# Patient Record
Sex: Female | Born: 1985 | Race: Black or African American | Hispanic: No | Marital: Single | Smoking: Current every day smoker
Health system: Southern US, Community
[De-identification: ages and names within clinical notes are randomized; demographics above are authoritative.]

## PROBLEM LIST (undated history)

## (undated) ENCOUNTER — Inpatient Hospital Stay (HOSPITAL_COMMUNITY): Payer: Self-pay

---

## 2011-12-14 ENCOUNTER — Other Ambulatory Visit: Payer: Self-pay

## 2011-12-20 ENCOUNTER — Other Ambulatory Visit (HOSPITAL_COMMUNITY): Payer: Self-pay | Admitting: Obstetrics and Gynecology

## 2011-12-20 DIAGNOSIS — IMO0002 Reserved for concepts with insufficient information to code with codable children: Secondary | ICD-10-CM

## 2011-12-22 ENCOUNTER — Ambulatory Visit (HOSPITAL_COMMUNITY)
Admission: RE | Admit: 2011-12-22 | Discharge: 2011-12-22 | Disposition: A | Discharge status: Home / Self Care | Payer: Medicaid Other | Source: Ambulatory Visit | Attending: Obstetrics and Gynecology | Admitting: Obstetrics and Gynecology

## 2011-12-22 ENCOUNTER — Encounter (HOSPITAL_COMMUNITY): Payer: Self-pay

## 2011-12-22 VITALS — BP 112/68 | HR 92 | Wt 226.2 lb

## 2011-12-22 DIAGNOSIS — IMO0002 Reserved for concepts with insufficient information to code with codable children: Secondary | ICD-10-CM

## 2011-12-22 DIAGNOSIS — O358XX0 Maternal care for other (suspected) fetal abnormality and damage, not applicable or unspecified: Secondary | ICD-10-CM | POA: Insufficient documentation

## 2011-12-22 IMAGING — US US OB DETAIL+14 WK
1 series · 16 of 28 positions shown · non-contrast
Comparison: none

[Series 1: us ob detail+14 wk · 0.24mm/px · 16 of 103 slices shown]
[im 1/103]
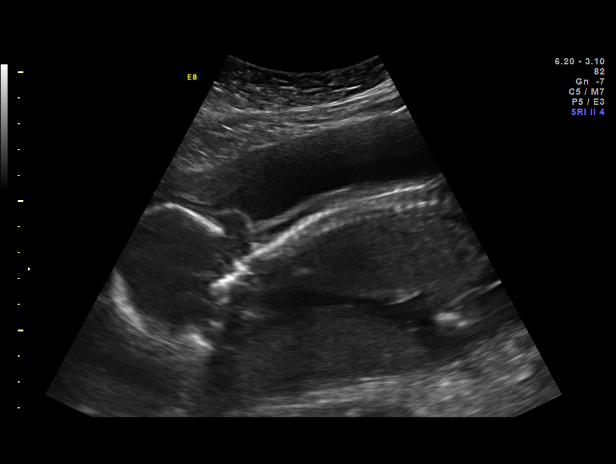
[im 8/103]
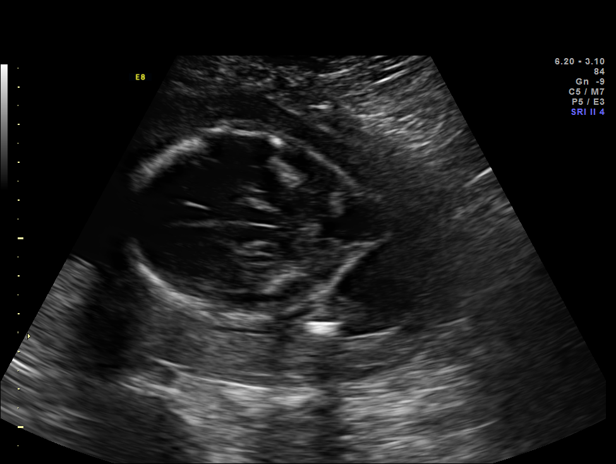
[im 16/103]
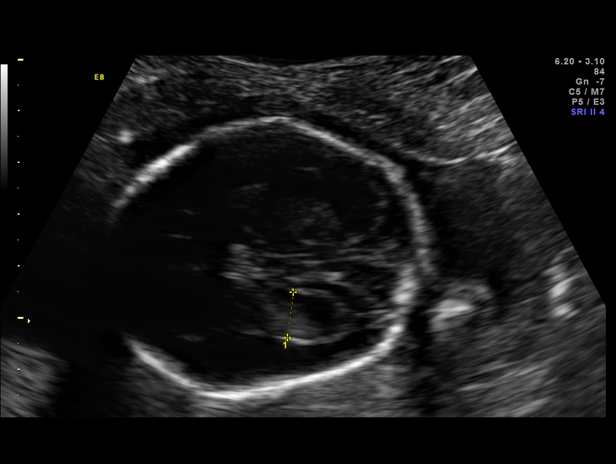
[im 23/103]
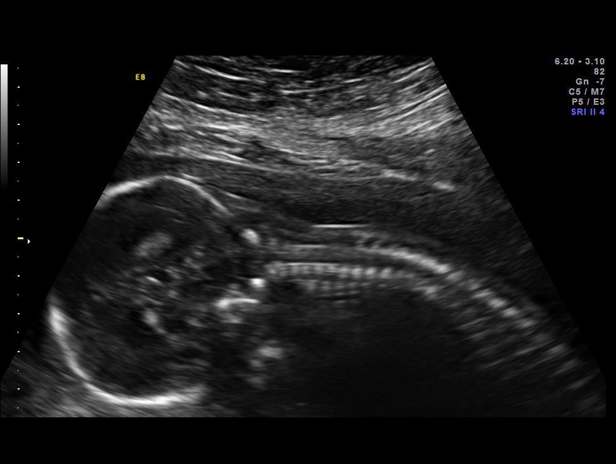
[im 27/103]
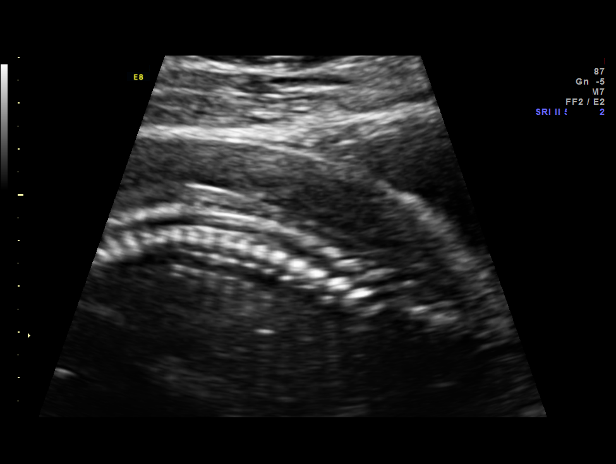
[im 35/103]
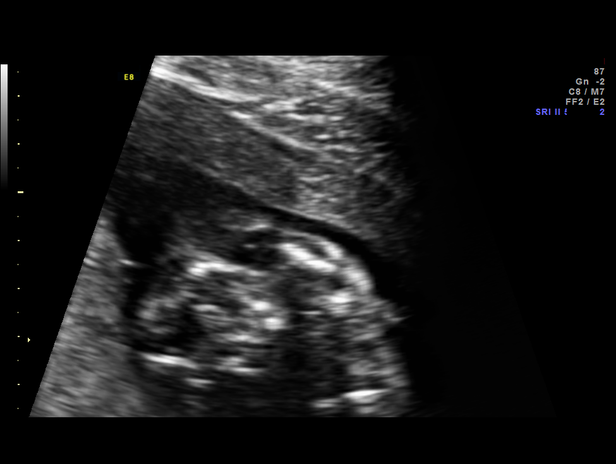
[im 42/103]
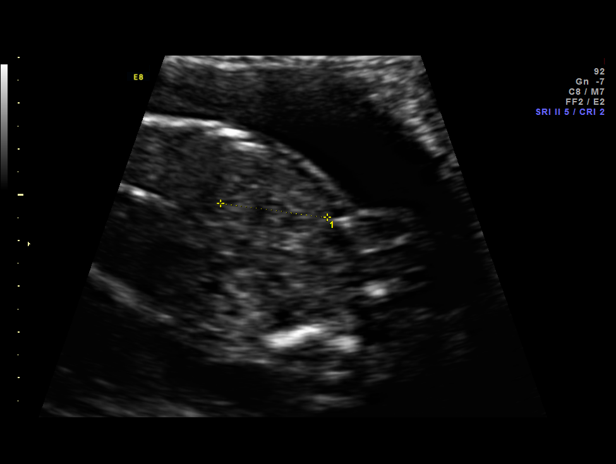
[im 50/103]
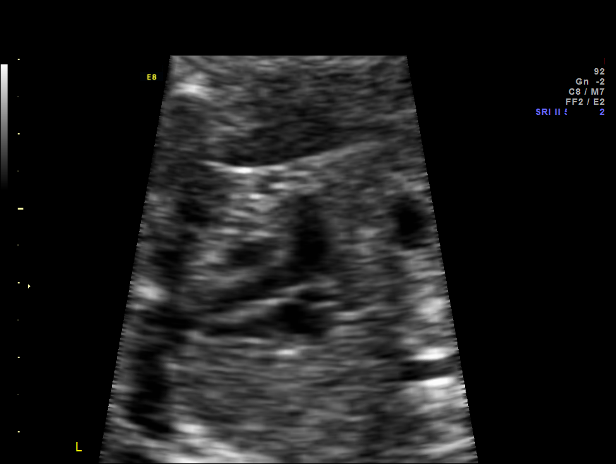
[im 53/103]
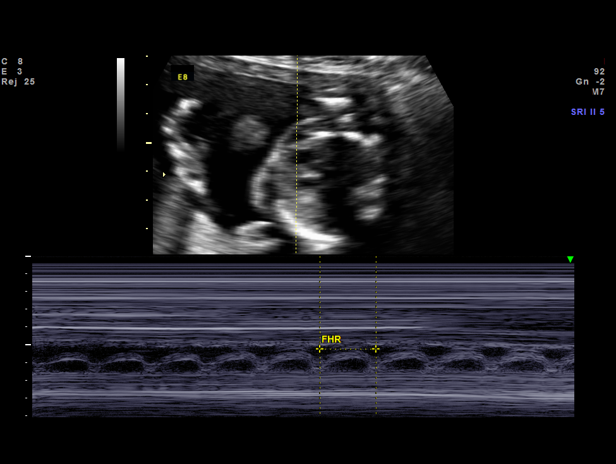
[im 61/103]
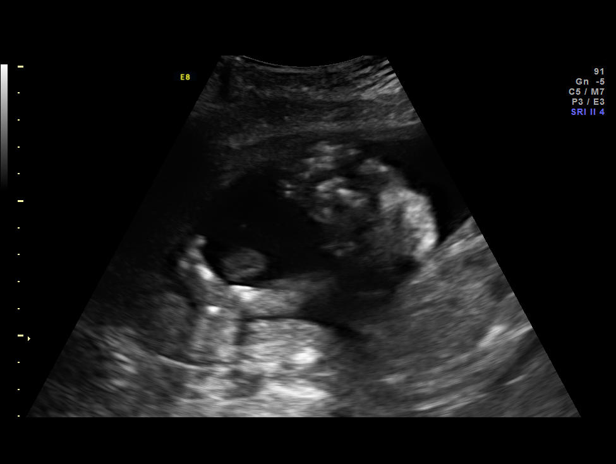
[im 69/103]
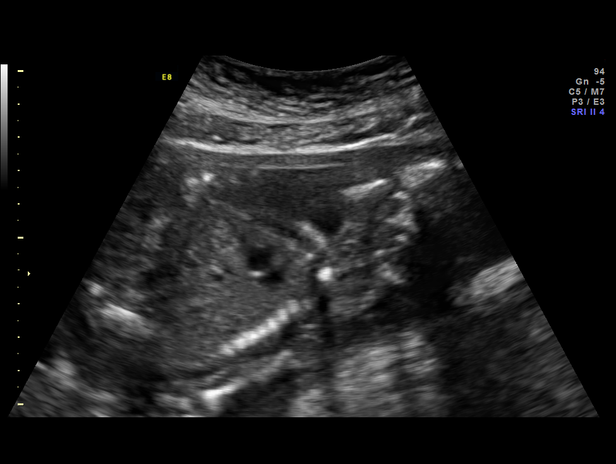
[im 76/103]
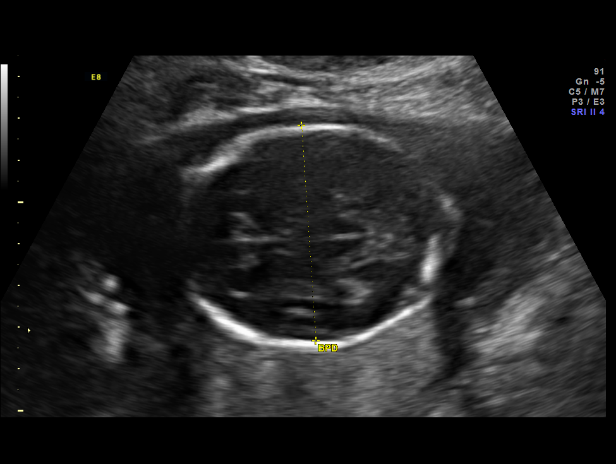
[im 80/103]
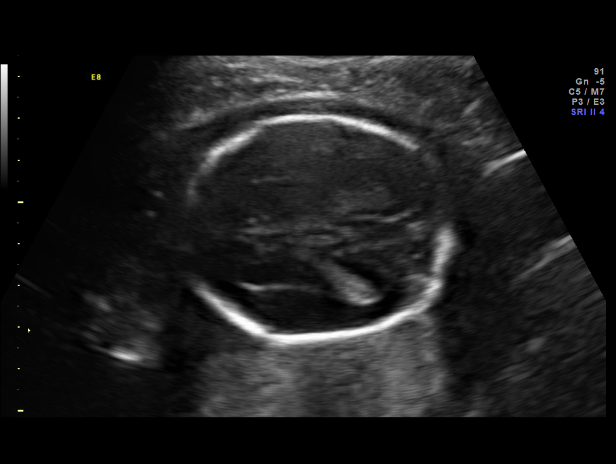
[im 87/103]
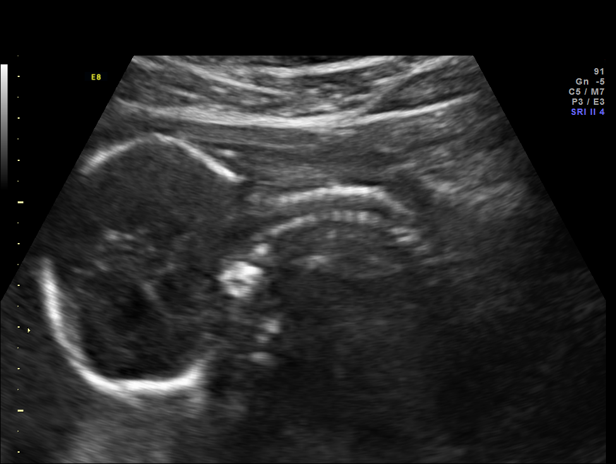
[im 95/103]
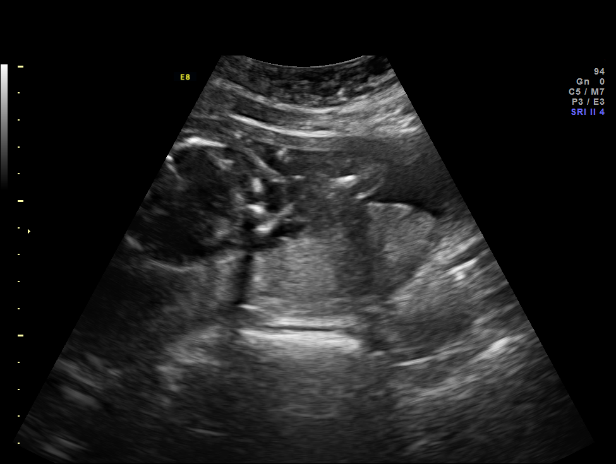
[im 103/103]
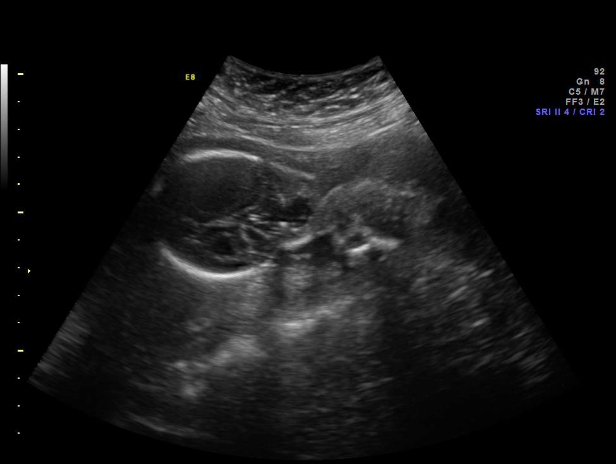

[16 of 28 positions shown; findings below may reference images not displayed]

OBSTETRICS REPORT
                      (Signed Final 12/22/2011 [DATE])

 Order#:         [id number]
Procedures

 US OB DETAIL + 14 WK                                  76811.0
Indications

 Fetal abnormality - other known or suspected
 (specify) cystic hygroma
Fetal Evaluation

 Fetal Heart Rate:  152                          bpm
 Cardiac Activity:  Observed
 Presentation:      Breech
 Placenta:          Posterior, above cervical
                    os
 P. Cord            Visualized
 Insertion:

 Amniotic Fluid
 AFI FV:      Subjectively within normal limits
                                             Larg Pckt:     4.3  cm
Biometry

 BPD:     51.8  mm     G. Age:  21w 5d                CI:         77.5   70 - 86
 OFD:     66.8  mm                                    FL/HC:      18.7   18.4 -

 HC:     191.8  mm     G. Age:  21w 3d       18  %    HC/AC:      1.14   1.06 -

 AC:     167.7  mm     G. Age:  21w 5d       36  %    FL/BPD:     69.3   71 - 87
 FL:      35.9  mm     G. Age:  21w 3d       21  %    FL/AC:      21.4   20 - 24
 HUM:     35.4  mm     G. Age:  22w 2d       55  %

 Est. FW:     435  gm    0 lb 15 oz      34  %
Gestational Age

 Clinical EDD:  22w 0d                                        EDD:   04/26/12
 U/S Today:     21w 4d                                        EDD:   04/29/12
 Best:          22w 0d     Det. By:  Clinical EDD             EDD:   04/26/12
Anatomy
 Cranium:           Abnormal, see       Aortic Arch:       Not well
                    comments
                                                           visualized
 Fetal Cavum:       Appears normal      Ductal Arch:       Not well
                                                           visualized
 Ventricles:        Ventriculomega      Diaphragm:         Appears normal
                    Mp, atrium
                    measures   mm
 Choroid Plexus:    Appears normal      Stomach:           Appears
                                                           normal, left
                                                           sided
 Cerebellum:        Abnormal, see       Abdomen:           Appears normal
                    comments
 Posterior Fossa:   Dandy Walker        Abdominal Wall:    Appears nml
                    complex
                                                           (cord insert,
                                                           abd wall)
 Nuchal Fold:       Not applicable      Cord Vessels:      Appears normal
                    (>20 wks GA)                           (3 vessel cord)
 Face:              Appears normal      Kidneys:           Appear normal
                    (lips/profile/orbit
                    s)
 Heart:             Appears normal      Bladder:           Appears normal
                    (4 chamber &
                    axis)
 RVOT:              Appears normal      Limbs:             Appears normal
                                                           (hands, ankles,
                                                           feet)
 LVOT:              Appears normal

 Other:     Male gender. Heels and 5th digit visualized.
Cervix Uterus Adnexa

 Cervical Length:    3        cm
 Left Ovary:    Not visualized.
 Right Ovary:   Not visualized.

 Adnexa:     No abnormality visualized.
Comments

 Ms. Mildre is referred due to cystic structure in the upper neck/
 skull.  Her quad screen was within normal limits (MSAFP
 MOM).  On ultrasound, an occipital encephalocele is noted.
 No brain matter is appreciated in the encephalocele sac.
 There appears to be a defect in the cerebellar vemis (Dandy
 Walker Variant).  The lateral ventricles measure 9 mm but
 "dangling choroids" are noted consistent with mild
 ventriculomegaly.  The remainder of the fetal anatomy
 appears normal, although somewhat limited views of the fetal
 heart were obtained.

 The findings and limitations of the study were discussed with
 the patient.  She was seen for genetics counseling - declined
 amniocentesis at this time and has elected to return for
 further follow up/ work up at a later time.
Impression

 Single IUP at 21 [DATE] weeks
 Occipiatal encephalocele
 Suspected Dandy Walker Variant
 Mild ventriculomegaly
 The remainder of the anatomy is within normal limits
 Normal amniotic fluid volume
Recommendations

 Recommend follow up ultrasound in 4 weeks with MFM.
 Will schedule consultation with Peds Neurosurgery.
 Plan fetal MRI to better evaluate the fetal cranial anatomy.
 Will also schedule fetal echo with Peds cardiology.

 Thank you for sharing in the care of Ms. TESY EXPRES with
 questions or concerns.

## 2011-12-22 NOTE — Progress Notes (Signed)
Tamara Johnston  was seen today for an ultrasound appointment.  See full report in [facility].  Alpha Gula, MD  [name] is referred due to cystic structure in the upper neck/ skull.  Her quad screen was within normal limits (MSAFP 1.7 MOM).  On ultrasound, an occipital encephalocele is noted.  No brain matter is appreciated in the encephalocele sac.  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  The lateral ventricles measure 9 mm but "dangling choroids" are noted consistent with mild ventriculomegaly.  The remainder of the fetal anatomy appears normal, although somewhat limited views of the fetal heart were obtained.  The findings and limitations of the study were discussed with the patient.  She was seen for genetics counseling - declined amniocentesis at this time and has elected to return for further follow up/ work up at a later time.  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 Will schedule consultation with Peds Neurosurgery. Plan fetal MRI to better evaluate the fetal cranial anatomy. Will also schedule fetal echo with Peds cardiology.

## 2011-12-23 ENCOUNTER — Telehealth (HOSPITAL_COMMUNITY): Payer: Self-pay | Admitting: MS"

## 2011-12-23 NOTE — Telephone Encounter (Signed)
Called [name] to follow-up from her visit yesterday to see how patient is doing, given that the patient was visibly upset, understandably so at the time of her visit. [name] stated that she is doing OK. I discussed that at the time of our visit, we had not discussed the option of termination of pregnancy and reviewed that this is an option for the pregnancy. [name] stated that she is not considering termination of pregnancy. Reviewed the follow-up plan that was discussed yesterday including follow-up ultrasound in 4 weeks, fetal MRI to better visualize the fetal brain, and fetal echocardiogram. Discussed that ultrasound did not suggest a concern with the fetal heart, but is offered given the association with encephalocele and other birth defects. Additionally, we discussed that we can facilitate a prenatal consult with neurosurgery. [name] agreed for [place] to schedule these appointments and contact her with appointment dates and times.   Clydie Braun Janiel Derhammer 12/23/2011 12:46 PM

## 2011-12-23 NOTE — Progress Notes (Signed)
Genetic Counseling  High-Risk Gestation Note  Appointment Date:  12/23/2011 Referred By: Tamara Hamming, MD Date of Birth:  1985/09/29    Pregnancy History: G2P0010 Estimated Date of Delivery: 04/24/12 Estimated Gestational Age: [redacted]w[redacted]d   I met with Ms. Tamara Tamara Johnston and her mother for genetic counseling because of abnormal ultrasound findings.  We began by reviewing the ultrasound in detail. Ultrasound performed today visualized occipital encephalocele, mild ventriculomegaly, and possible Dandy Walker variant. Complete ultrasound results are reported separately.   We discussed that an encephalocele is characterized by an opening in the fetal skull, which allows herniation of the meninges and, in some cases, the brain into the amniotic fluid.  When involved, the brain tissue is typically covered by a membrane.  We discussed that this finding fits within the neural tube defect spectrum and is associated with a variable prognosis.  The prognosis is largely dependent upon the presence and amount of brain in the herniated sac, presence of other defects, and the underlying etiology.  We reviewed that ~40% of pregnancies with an encephalocele have other associated intra and extra-cranial defects.  We discussed that encephaloceles are most commonly considered to be multifactorial in etiology, due to a combination of both environmental and genetic factors.  We reviewed known teratogens associated with an increased risk for NTDs including folic acid antagonists, maternal diabetes, hyperthermia, and folic acid deficiency.  We discussed the importance of folic acid in reducing the risk of recurrence and that a dose of 4 mg is recommended for women with a history of a child with an ONTD, including encephalocele.  Ms. Tamara Tamara Johnston was counseled that in the case of multifactorial inheritance, the risk of recurrence is ~3-4%.   We also reviewed that encephaloceles can be due to an underlying chromosome or single  gene condition.  Approximately 5-10% of encephaloceles are associated with a chromosome abnormality, specifically trisomy 50, 18, and mosaic trisomy 20.  We reviewed chromosomes, nondisjunction, and the common features of the above stated conditions.  Additionally, we discussed that an encephalocele is a major feature of multiple single gene conditions, such as Meckel Gruber Tamara Johnston.  We discussed that prenatal testing for some single gene conditions may be available, if an ultrasound or family history reveals findings indicative of a specific condition.  Aside from the encephalocele, there were no other ultrasound findings today concerning for Meckel Tamara Tamara Johnston.  We discussed the option of postnatal evaluation of the baby by a medical geneticist to determine whether the encephalocele is syndromic or isolated.  We discussed the option of amniocentesis including the risks, benefits, and limitations. We reviewed the approximate 1 in 300-500 risk for complications, including spontaneous pregnancy loss. We reviewed another available screening option: noninvasive prenatal testing (NIPT). Specifically, we discussed that NIPT analyzes cell free fetal DNA found in the maternal circulation. This test is not diagnostic for chromosome conditions, but can provide information regarding the presence or absence of extra fetal DNA for chromosomes 13, 18 and 21. Thus, it would not identify or rule out all fetal aneuploidy. The reported detection rate is greater than 99% for Trisomy 21, greater than 97% for Trisomy 18, and is approximately 80% (8 out of 10) for Trisomy 13. The false positive rate is reported to be less than 0.1% for any of these conditions.  Tamara Tamara Johnston declined the option of amniocentesis today, both for single gene and chromosome testing.  She stated that she may consider NIPT ([facility]), but did not want to pursue this  testing today.  We also discussed the options of fetal MRI, echocardiogram, serial  ultrasonography, and consultation with pediatric neurosurgery.  Tamara Tamara Johnston stated that she would be interested in these but preferred for [place] to schedule these appointments and call her back. Tamara Tamara Johnston was understandably overwhelmed with the information presented at the time of today's visit and asked for [place] to call her back at a later date. We will notify the patient with the time and dates of these appointments.  Both family histories were briefly reviewed. A detailed family history was not obtained given the patient's emotional state at the time of today's visit. The patient reportedly had a maternal uncle with slight learning delays. He died as a result of an aneurysm. An underlying cause for his delays was not known. There are many different causes of mental retardation such as genetic differences, sporadic causes, or injuries.  A specific diagnosis for mental retardation can be determined in approximately 50% of cases.  In the remaining 50% of cases, a diagnosis may not ever be determined.  Without more specific information, potential genetic risks cannot be determined. Ms. Tamara Tamara Johnston was not familiar with the father of the baby's family history.  We, therefore, cannot comment on how his history might contribute to the overall chance for the baby to have a birth defect or underlying genetic condition. Without further information regarding the provided family history, an accurate genetic risk cannot be calculated. Further genetic counseling is warranted if more information is obtained.  Ms. Tamara Tamara Johnston Sulser denied exposure to environmental toxins or chemical agents. She denied the use of alcohol, tobacco or street drugs. She denied significant viral illnesses during the course of her pregnancy. Her medical and surgical histories were noncontributory.   I counseled Ms. Tamara Tamara Johnston regarding the above risks and available options.  The approximate face-to-face time with the genetic counselor was 25  minutes.  Tamara Plowman, MS Certified Genetic Counselor 12/23/2011

## 2012-01-19 ENCOUNTER — Ambulatory Visit (HOSPITAL_COMMUNITY)
Admission: RE | Admit: 2012-01-19 | Discharge: 2012-01-19 | Disposition: A | Discharge status: Home / Self Care | Payer: Medicaid Other | Source: Ambulatory Visit | Attending: Obstetrics and Gynecology | Admitting: Obstetrics and Gynecology

## 2012-01-19 VITALS — BP 106/76 | HR 85 | Wt 234.0 lb

## 2012-01-19 DIAGNOSIS — O358XX0 Maternal care for other (suspected) fetal abnormality and damage, not applicable or unspecified: Secondary | ICD-10-CM | POA: Insufficient documentation

## 2012-02-07 ENCOUNTER — Encounter: Payer: Self-pay | Admitting: Maternal and Fetal Medicine

## 2012-02-07 ENCOUNTER — Other Ambulatory Visit: Payer: Self-pay

## 2012-02-16 ENCOUNTER — Inpatient Hospital Stay (HOSPITAL_COMMUNITY)
Admission: AD | Admit: 2012-02-16 | Discharge: 2012-02-16 | Disposition: A | Discharge status: Home / Self Care | Payer: Medicaid Other | Source: Ambulatory Visit | Attending: Family Medicine | Admitting: Family Medicine

## 2012-02-16 ENCOUNTER — Other Ambulatory Visit (HOSPITAL_COMMUNITY): Payer: Self-pay | Admitting: Maternal and Fetal Medicine

## 2012-02-16 ENCOUNTER — Ambulatory Visit (HOSPITAL_COMMUNITY)
Admission: RE | Admit: 2012-02-16 | Discharge: 2012-02-16 | Disposition: A | Discharge status: Home / Self Care | Payer: Medicaid Other | Source: Ambulatory Visit | Attending: Obstetrics and Gynecology | Admitting: Obstetrics and Gynecology

## 2012-02-16 VITALS — BP 115/84 | HR 93 | Wt 239.5 lb

## 2012-02-16 DIAGNOSIS — O358XX0 Maternal care for other (suspected) fetal abnormality and damage, not applicable or unspecified: Secondary | ICD-10-CM

## 2012-02-16 DIAGNOSIS — O4700 False labor before 37 completed weeks of gestation, unspecified trimester: Secondary | ICD-10-CM | POA: Insufficient documentation

## 2012-02-16 DIAGNOSIS — O365990 Maternal care for other known or suspected poor fetal growth, unspecified trimester, not applicable or unspecified: Secondary | ICD-10-CM

## 2012-02-16 DIAGNOSIS — Z3689 Encounter for other specified antenatal screening: Secondary | ICD-10-CM | POA: Insufficient documentation

## 2012-02-16 MED ORDER — BETAMETHASONE SOD PHOS & ACET 6 (3-3) MG/ML IJ SUSP
12.0000 mg | Freq: Once | INTRAMUSCULAR | Status: AC
  Administered 2012-02-16: 12 mg via INTRAMUSCULAR
  Filled 2012-02-16: qty 2

## 2012-02-16 NOTE — MAU Note (Signed)
From MFM for betamethasone injection only.

## 2012-02-16 NOTE — MAU Note (Signed)
No adverse reaction 

## 2012-02-16 NOTE — ED Notes (Signed)
Pt taken to MAU for BMZ.

## 2012-02-16 NOTE — Progress Notes (Signed)
Tamara Johnston  was seen today for an ultrasound appointment.  See full report in [facility].  Alpha Gula, MD  IUP at 30+0 weeks Occipital encephalocele, no neural tissue in sac, small (1.5 x 1.6 cms) Mild ventriculomegaly (lateral venticles measure 1.3 cm) Normal amniotic fluid volume  Estimated fetal weight at the 14th%tile, the AC measures at the 6th %tile (asymmetric growth) Umbilical artery Dopplers at the 89th%tile for gestational age. No evidence of absent or reversed diastolic flow.  TVUS - cervical length 1.6 cm with some V-shaped funneling  Fetal ECHO on 09/09 at [facility] - normal Fetal MRI at [facility] - Cerebellum appears normal, posterior cephalocele, partial agenesis of the corpus collosum Has appointment with Peds Neurosurgery today  Betamesthasone for fetal lung maturity due to shortened cervix (received first dose in MAU today) Arrangements made for transfer OB appointment at [facility] Weekly BPPs / Umbilical artery Doppler studies Follow up growth scan in 3 weeks.

## 2012-02-17 ENCOUNTER — Inpatient Hospital Stay (HOSPITAL_COMMUNITY)
Admission: AD | Admit: 2012-02-17 | Discharge: 2012-02-17 | Disposition: A | Discharge status: Home / Self Care | Payer: Medicaid Other | Source: Ambulatory Visit | Attending: Obstetrics & Gynecology | Admitting: Obstetrics & Gynecology

## 2012-02-17 DIAGNOSIS — O4700 False labor before 37 completed weeks of gestation, unspecified trimester: Secondary | ICD-10-CM | POA: Insufficient documentation

## 2012-02-17 MED ORDER — BETAMETHASONE SOD PHOS & ACET 6 (3-3) MG/ML IJ SUSP
12.0000 mg | Freq: Once | INTRAMUSCULAR | Status: AC
  Administered 2012-02-17: 12 mg via INTRAMUSCULAR
  Filled 2012-02-17: qty 2

## 2012-02-23 ENCOUNTER — Ambulatory Visit (HOSPITAL_COMMUNITY)
Admission: RE | Admit: 2012-02-23 | Discharge: 2012-02-23 | Disposition: A | Discharge status: Home / Self Care | Payer: Medicaid Other | Source: Ambulatory Visit | Attending: Obstetrics and Gynecology | Admitting: Obstetrics and Gynecology

## 2012-02-23 DIAGNOSIS — O358XX0 Maternal care for other (suspected) fetal abnormality and damage, not applicable or unspecified: Secondary | ICD-10-CM | POA: Insufficient documentation

## 2012-02-23 DIAGNOSIS — O365990 Maternal care for other known or suspected poor fetal growth, unspecified trimester, not applicable or unspecified: Secondary | ICD-10-CM

## 2012-02-28 ENCOUNTER — Other Ambulatory Visit (HOSPITAL_COMMUNITY): Payer: Self-pay | Admitting: Maternal and Fetal Medicine

## 2012-02-28 DIAGNOSIS — O359XX0 Maternal care for (suspected) fetal abnormality and damage, unspecified, not applicable or unspecified: Secondary | ICD-10-CM

## 2012-03-01 ENCOUNTER — Encounter (HOSPITAL_COMMUNITY): Payer: Self-pay

## 2012-03-01 ENCOUNTER — Ambulatory Visit (HOSPITAL_COMMUNITY)
Admission: RE | Admit: 2012-03-01 | Discharge: 2012-03-01 | Disposition: A | Discharge status: Home / Self Care | Payer: Medicaid Other | Source: Ambulatory Visit | Attending: Obstetrics and Gynecology | Admitting: Obstetrics and Gynecology

## 2012-03-01 DIAGNOSIS — O358XX0 Maternal care for other (suspected) fetal abnormality and damage, not applicable or unspecified: Secondary | ICD-10-CM | POA: Insufficient documentation

## 2012-03-01 DIAGNOSIS — O359XX0 Maternal care for (suspected) fetal abnormality and damage, unspecified, not applicable or unspecified: Secondary | ICD-10-CM

## 2012-03-01 DIAGNOSIS — O26879 Cervical shortening, unspecified trimester: Secondary | ICD-10-CM | POA: Insufficient documentation

## 2012-03-01 DIAGNOSIS — O365990 Maternal care for other known or suspected poor fetal growth, unspecified trimester, not applicable or unspecified: Secondary | ICD-10-CM | POA: Insufficient documentation

## 2012-03-01 IMAGING — US US FETAL BPP W/O NONSTRESS
1 series · 11 of 11 positions shown · non-contrast
Comparison: none

[Series 1: us fetal bpp w/o nonstress · 0.23mm/px · 11 of 11 slices shown]
[im 1/11]
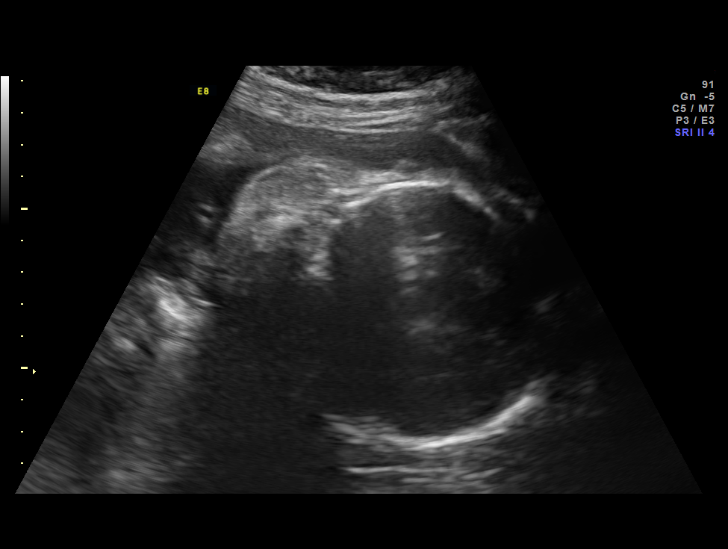
[im 2/11]
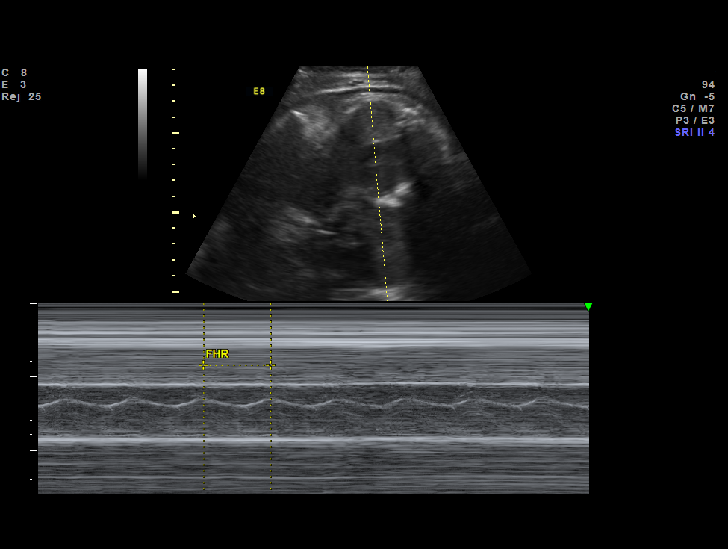
[im 3/11]
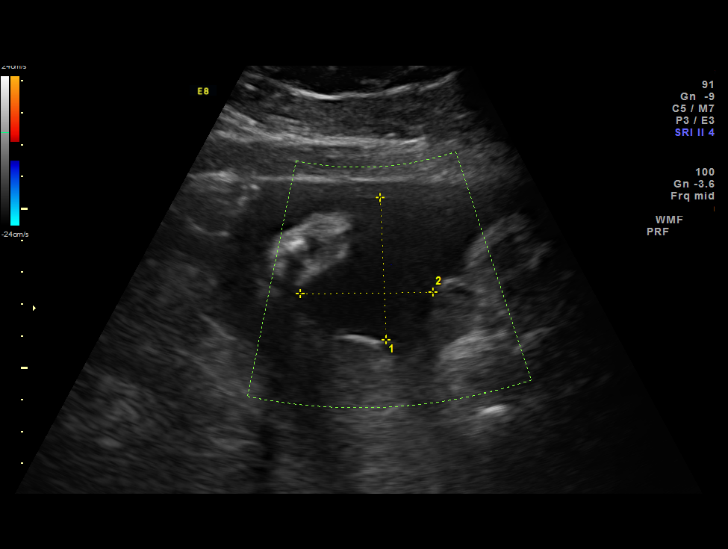
[im 4/11]
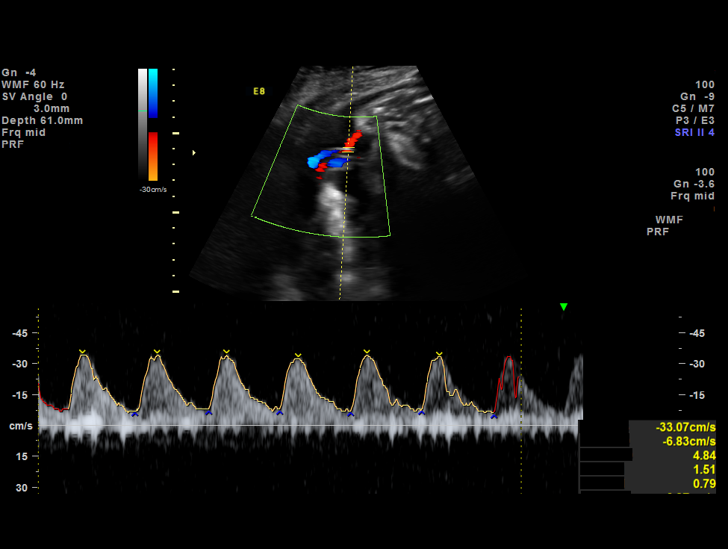
[im 5/11]
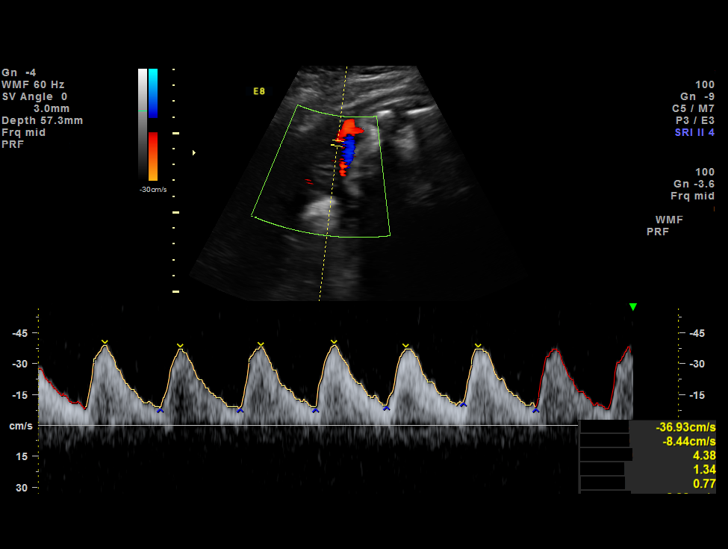
[im 6/11]
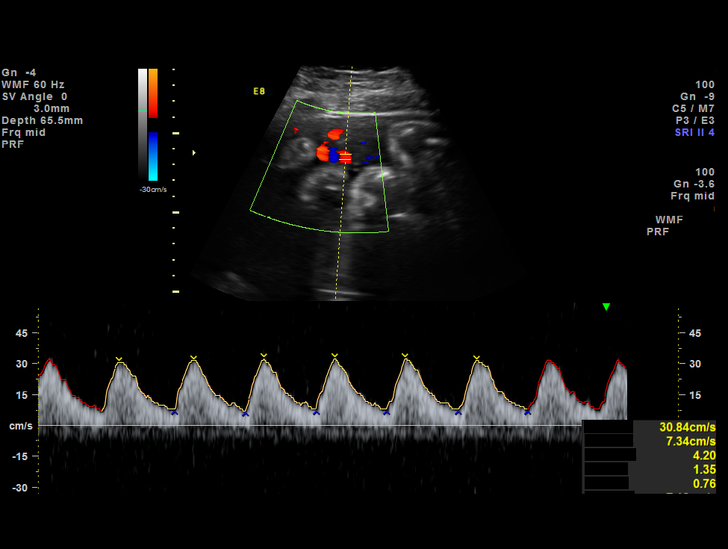
[im 7/11]
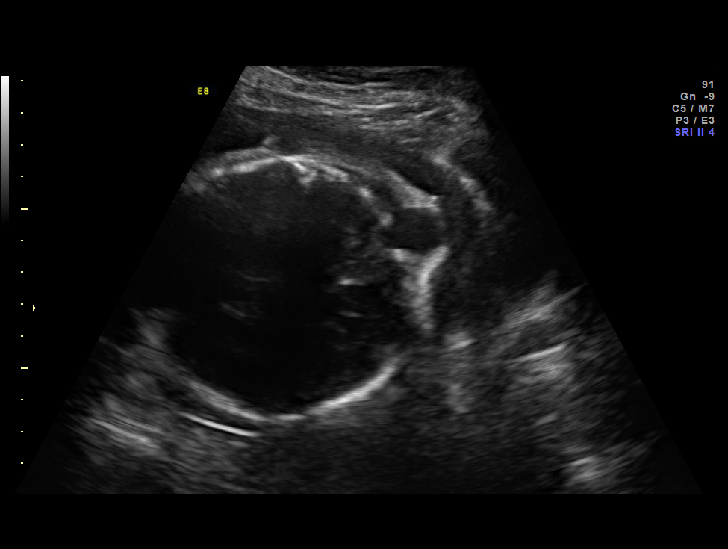
[im 8/11]
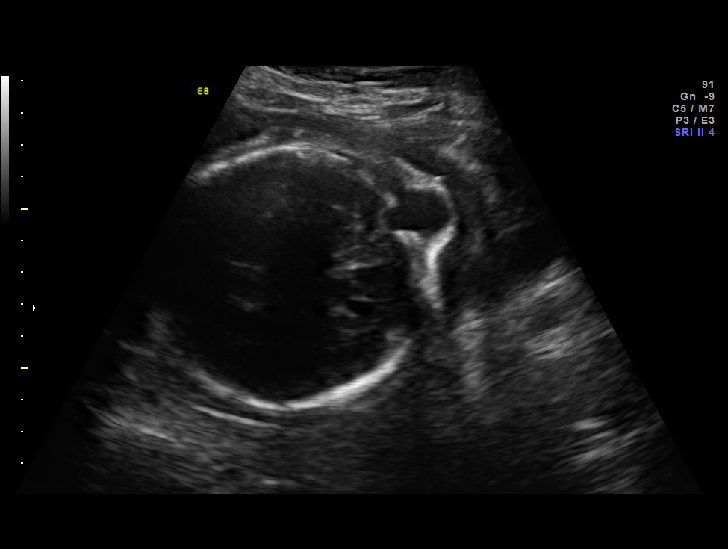
[im 9/11]
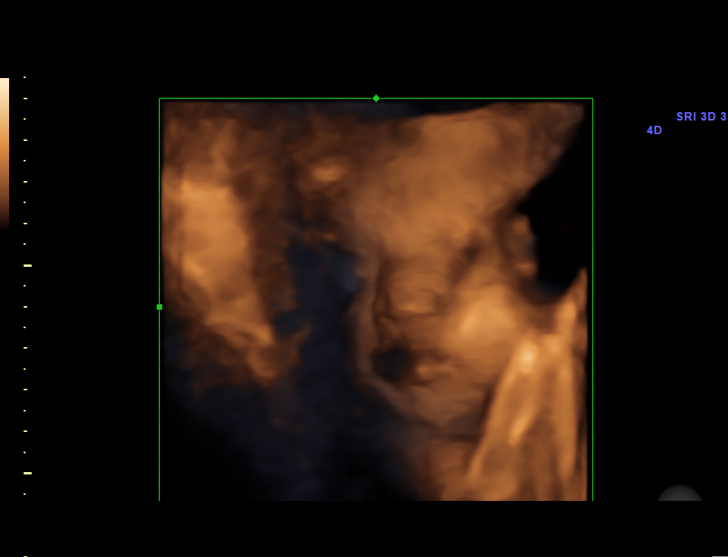
[im 10/11]
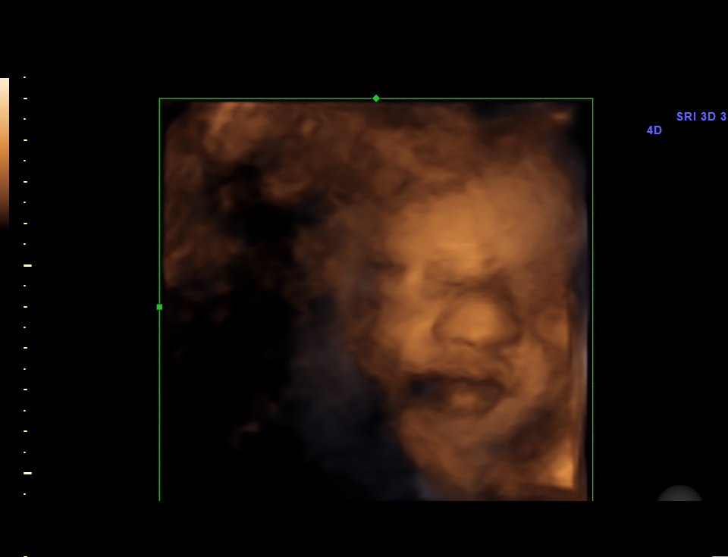
[im 11/11]
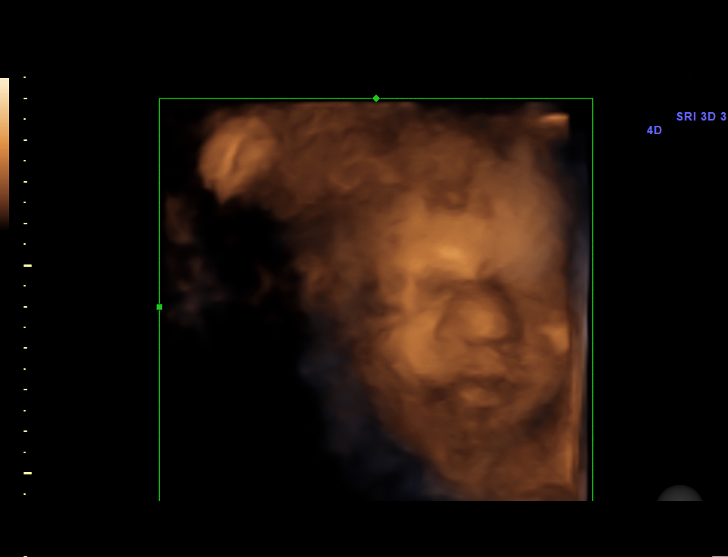

[11 of 11 positions shown; findings below may reference images not displayed]

OBSTETRICS REPORT
                      (Signed Final 03/01/2012 [DATE])

Service(s) Provided

 US UA CORD DOPPLER                                    76820.0
Indications

 Fetal abnormality - occipital encephalocele,
 ventriculomegaly, partial agenesis of corpus
 collosum
 Size less than dates (Small for gestational [AGE]
 FGR)
 Cervical shortening
 Assess fetal well being
Fetal Evaluation

 Num Of Fetuses:    1
 Fetal Heart Rate:  150                          bpm
 Cardiac Activity:  Observed

 Comment:    Breathing noted intermittently, but not sustained.

 Amniotic Fluid
 AFI FV:      Subjectively within normal limits
                                             Larg Pckt:     4.5  cm
Biophysical Evaluation

 Amniotic F.V:   Pocket => 2 cm two         F. Tone:        Observed
                 planes
 F. Movement:    Observed                   N.S.T:          Reactive
 F. Breathing:   Not Observed               Score:          [DATE]
Gestational Age

 LMP:           32w 2d        Date:  07/19/11                 EDD:   04/24/12
 Best:          32w 2d     Det. By:  LMP  (07/19/11)          EDD:   04/24/12
Doppler - Fetal Vessels

 Umbilical Artery
 S/D:   4.4        > 97.5  %tile       RI:
 PI:    1.34                           PSV:       36.9    cm/s
 Umbilical Artery
 Absent DFV:    No     Reverse DFV:    No

Impression

 IUP at 32+2 weeks
 Encephalocele; fetal growth restriction
 Normal amniotic fluid volume
 UA dopplers were elevated for this GA; continuous diastolic
 flow
 BPP [DATE] (-2 for no BM)
Recommendations

 Continue twice weekly testing

 Thank you for sharing in the care of Ms. SOLIJON NGOC with
 questions or concerns.

## 2012-03-06 ENCOUNTER — Other Ambulatory Visit: Payer: Self-pay

## 2012-03-08 ENCOUNTER — Other Ambulatory Visit (HOSPITAL_COMMUNITY): Payer: Self-pay | Admitting: Obstetrics and Gynecology

## 2012-03-08 DIAGNOSIS — O358XX0 Maternal care for other (suspected) fetal abnormality and damage, not applicable or unspecified: Secondary | ICD-10-CM

## 2012-03-09 ENCOUNTER — Other Ambulatory Visit (HOSPITAL_COMMUNITY): Payer: Self-pay | Admitting: Obstetrics and Gynecology

## 2012-03-09 ENCOUNTER — Ambulatory Visit (HOSPITAL_COMMUNITY)
Admission: RE | Admit: 2012-03-09 | Discharge: 2012-03-09 | Disposition: A | Discharge status: Home / Self Care | Payer: Medicaid Other | Source: Ambulatory Visit | Attending: Obstetrics and Gynecology | Admitting: Obstetrics and Gynecology

## 2012-03-09 ENCOUNTER — Ambulatory Visit (HOSPITAL_COMMUNITY): Admission: RE | Admit: 2012-03-09 | Payer: Medicaid Other | Source: Ambulatory Visit

## 2012-03-09 VITALS — BP 117/76 | HR 95 | Wt 249.5 lb

## 2012-03-09 DIAGNOSIS — O358XX0 Maternal care for other (suspected) fetal abnormality and damage, not applicable or unspecified: Secondary | ICD-10-CM

## 2012-03-09 DIAGNOSIS — O365990 Maternal care for other known or suspected poor fetal growth, unspecified trimester, not applicable or unspecified: Secondary | ICD-10-CM | POA: Insufficient documentation

## 2012-03-09 DIAGNOSIS — O26879 Cervical shortening, unspecified trimester: Secondary | ICD-10-CM | POA: Insufficient documentation

## 2012-03-09 NOTE — Progress Notes (Signed)
Tamara Johnston  was seen today for an ultrasound appointment.  See full report in [facility].  IUP at 33 3/7 weeks weeks  Encephalocele, mild ventriculomegaly and suspected agenesis of the corpus collosum Fetal growth restriction by ultrasound at [facility] earlier this week Normal amniotic fluid volume  UA dopplers were elevated for this GA, but no absent or reversed diastolic flow BPP 8/8   Recommend continued 2x weekly antepartum fetal testing (NST q Monday; BPP with UA Dopplers q Thursday) Patient scheduled to return to [facility] in 2 weeks - plan delivery at [facility].  Alpha Gula, MD

## 2012-03-12 ENCOUNTER — Ambulatory Visit (HOSPITAL_COMMUNITY)
Admission: RE | Admit: 2012-03-12 | Discharge: 2012-03-12 | Disposition: A | Discharge status: Home / Self Care | Payer: Medicaid Other | Source: Ambulatory Visit | Attending: Obstetrics and Gynecology | Admitting: Obstetrics and Gynecology

## 2012-03-12 ENCOUNTER — Other Ambulatory Visit (HOSPITAL_COMMUNITY): Payer: Self-pay | Admitting: Maternal and Fetal Medicine

## 2012-03-12 VITALS — BP 116/74 | HR 89 | Wt 253.2 lb

## 2012-03-12 DIAGNOSIS — O358XX0 Maternal care for other (suspected) fetal abnormality and damage, not applicable or unspecified: Secondary | ICD-10-CM | POA: Insufficient documentation

## 2012-03-12 DIAGNOSIS — O365990 Maternal care for other known or suspected poor fetal growth, unspecified trimester, not applicable or unspecified: Secondary | ICD-10-CM | POA: Insufficient documentation

## 2012-03-12 DIAGNOSIS — O368390 Maternal care for abnormalities of the fetal heart rate or rhythm, unspecified trimester, not applicable or unspecified: Secondary | ICD-10-CM | POA: Insufficient documentation

## 2012-03-12 DIAGNOSIS — O350XX0 Maternal care for (suspected) central nervous system malformation in fetus, not applicable or unspecified: Secondary | ICD-10-CM

## 2012-03-12 DIAGNOSIS — O26879 Cervical shortening, unspecified trimester: Secondary | ICD-10-CM | POA: Insufficient documentation

## 2012-03-12 NOTE — Progress Notes (Signed)
Tamara Johnston  was seen today for an ultrasound appointment.  See full report in [facility].  Single IUP at 33 6/7 weeks Active fetus - BPP 8/10 (-2 for non reactive NST) Normal amniotic fluid volume  Continue 2x weekly antepartum fetal testing.  Has follow up scheduled later this week.  Alpha Gula, MD

## 2012-03-15 ENCOUNTER — Ambulatory Visit (HOSPITAL_COMMUNITY)
Admission: RE | Admit: 2012-03-15 | Discharge: 2012-03-15 | Disposition: A | Discharge status: Home / Self Care | Payer: Medicaid Other | Source: Ambulatory Visit | Attending: Obstetrics and Gynecology | Admitting: Obstetrics and Gynecology

## 2012-03-15 DIAGNOSIS — O365990 Maternal care for other known or suspected poor fetal growth, unspecified trimester, not applicable or unspecified: Secondary | ICD-10-CM | POA: Insufficient documentation

## 2012-03-15 DIAGNOSIS — O26879 Cervical shortening, unspecified trimester: Secondary | ICD-10-CM | POA: Insufficient documentation

## 2012-03-15 DIAGNOSIS — O358XX0 Maternal care for other (suspected) fetal abnormality and damage, not applicable or unspecified: Secondary | ICD-10-CM | POA: Insufficient documentation

## 2012-03-15 NOTE — Progress Notes (Signed)
[facility]  Indication: 26 yr old G2P0010 at [redacted]w[redacted]d with fetus with occipital encephalocele and fetal growth restriction with elevated umbilical artery Doppler studies for biophysical profile and Doppler studies.  Findings: 1. Single intrauterine pregnancy. 2. Posterior placenta without evidence of previa. 3. Normal amniotic fluid volume. 4. The lateral ventricles appear normal on today's exam. 5. The umbilical artery Doppler studies again show and elevated S/D ratio. There is persistent forward flow.  Recommendations: 1. Fetal encephalocele: - previously counseled - had met with Pediatric Neurosurgery - will discuss mode and timing of delivery with Dr. Rachel Bo on 11/91/13. 2. Fetal growth restriction with abnormal Doppler studies: - previously counseled - has received betamethasone - continue antenatal testing and weekly Doppler studies - for fetal growth on 03/20/12 - will likely be delivered at [redacted] weeks gestation 3. Discussed fetal kick counts  Eulis Foster, MD

## 2012-03-19 ENCOUNTER — Other Ambulatory Visit (HOSPITAL_COMMUNITY): Payer: Medicaid Other

## 2012-03-22 ENCOUNTER — Ambulatory Visit (HOSPITAL_COMMUNITY)
Admission: RE | Admit: 2012-03-22 | Discharge: 2012-03-22 | Disposition: A | Discharge status: Home / Self Care | Payer: Medicaid Other | Source: Ambulatory Visit | Attending: Maternal and Fetal Medicine | Admitting: Maternal and Fetal Medicine

## 2012-03-22 VITALS — BP 121/76 | HR 82 | Wt 258.8 lb

## 2012-03-22 DIAGNOSIS — O358XX0 Maternal care for other (suspected) fetal abnormality and damage, not applicable or unspecified: Secondary | ICD-10-CM | POA: Insufficient documentation

## 2012-03-22 DIAGNOSIS — O26879 Cervical shortening, unspecified trimester: Secondary | ICD-10-CM | POA: Insufficient documentation

## 2012-03-22 DIAGNOSIS — IMO0002 Reserved for concepts with insufficient information to code with codable children: Secondary | ICD-10-CM

## 2012-03-22 DIAGNOSIS — O365990 Maternal care for other known or suspected poor fetal growth, unspecified trimester, not applicable or unspecified: Secondary | ICD-10-CM | POA: Insufficient documentation

## 2012-03-22 NOTE — Progress Notes (Signed)
[facility]  Indication: 26 yr old G2P0010 at [redacted]w[redacted]d with fetus with occipital encephalocele and fetal growth restriction with elevated umbilical artery Doppler studies for biophysical profile and Doppler studies.  Findings: 1. Single intrauterine pregnancy. 2. Posterior placenta without evidence of previa. 3. Normal amniotic fluid volume. 4. The lateral ventricles appear normal on today's exam. 5. The umbilical artery Doppler studies again show and elevated S/D ratio. There is persistent forward flow. 6. Normal biophysical profile of 8/8.  Recommendations: 1. Fetal encephalocele: - previously counseled - had met with Pediatric Neurosurgery - patient is scheduled for induction on 04/04/12. 2. Fetal growth restriction with abnormal Doppler studies: - previously counseled - has received betamethasone - continue antenatal testing and weekly Doppler studies - had fetal growth on 03/20/12- continues to be <3rd% but there was adequate interval growth - will be delivered at [redacted] weeks gestation 3. Discussed fetal kick counts  Eulis Foster, MD

## 2012-03-30 ENCOUNTER — Ambulatory Visit (HOSPITAL_COMMUNITY)
Admission: RE | Admit: 2012-03-30 | Discharge: 2012-03-30 | Disposition: A | Discharge status: Home / Self Care | Payer: Medicaid Other | Source: Ambulatory Visit | Attending: Obstetrics and Gynecology | Admitting: Obstetrics and Gynecology

## 2012-03-30 ENCOUNTER — Encounter (HOSPITAL_COMMUNITY): Payer: Self-pay

## 2012-03-30 ENCOUNTER — Other Ambulatory Visit (HOSPITAL_COMMUNITY): Payer: Self-pay | Admitting: Obstetrics and Gynecology

## 2012-03-30 DIAGNOSIS — O26879 Cervical shortening, unspecified trimester: Secondary | ICD-10-CM | POA: Insufficient documentation

## 2012-03-30 DIAGNOSIS — O358XX0 Maternal care for other (suspected) fetal abnormality and damage, not applicable or unspecified: Secondary | ICD-10-CM | POA: Insufficient documentation

## 2012-03-30 DIAGNOSIS — IMO0002 Reserved for concepts with insufficient information to code with codable children: Secondary | ICD-10-CM

## 2012-03-30 DIAGNOSIS — O365990 Maternal care for other known or suspected poor fetal growth, unspecified trimester, not applicable or unspecified: Secondary | ICD-10-CM | POA: Insufficient documentation

## 2012-03-30 NOTE — Progress Notes (Signed)
[facility] Ultrasound, Staff note:  Indication: 26 yr old G2P0010 at [redacted]w[redacted]d with fetus with occipital encephalocele and fetal growth restriction with elevated umbilical artery Doppler studies for biophysical profile and Doppler studies.  Findings: 1. Single intrauterine pregnancy with EFW <10th percentile but appropriate interval growth. 2. Posterior placenta without evidence of previa. 3. Normal amniotic fluid volume. 4. The lateral ventricles appear normal on today's exam. 5. The umbilical artery Doppler studies again show and elevated S/D ratio. There is persistent forward flow. 6. Normal biophysical profile of 8/8.  Recommendations: 1. Fetal encephalocele: - previously counseled - had met with Pediatric Neurosurgery - patient is scheduled for induction on 04/04/12. 2. Fetal growth restriction with abnormal Doppler studies: - previously counseled - has received betamethasone - continue antenatal testing and weekly Doppler studies - had fetal growth today continues to be IUGR but there was adequate interval growth - will be delivered at [redacted] weeks gestation (04/04/12) 3. Discussed fetal kick counts

## 2012-06-25 ENCOUNTER — Encounter (HOSPITAL_BASED_OUTPATIENT_CLINIC_OR_DEPARTMENT_OTHER): Payer: Self-pay | Admitting: Emergency Medicine

## 2012-06-25 DIAGNOSIS — T628X1A Toxic effect of other specified noxious substances eaten as food, accidental (unintentional), initial encounter: Secondary | ICD-10-CM | POA: Insufficient documentation

## 2012-06-25 DIAGNOSIS — R21 Rash and other nonspecific skin eruption: Secondary | ICD-10-CM | POA: Insufficient documentation

## 2012-06-25 DIAGNOSIS — F17200 Nicotine dependence, unspecified, uncomplicated: Secondary | ICD-10-CM | POA: Insufficient documentation

## 2012-06-25 DIAGNOSIS — Y939 Activity, unspecified: Secondary | ICD-10-CM | POA: Insufficient documentation

## 2012-06-25 DIAGNOSIS — Y929 Unspecified place or not applicable: Secondary | ICD-10-CM | POA: Insufficient documentation

## 2012-06-25 DIAGNOSIS — L299 Pruritus, unspecified: Secondary | ICD-10-CM | POA: Insufficient documentation

## 2012-06-25 NOTE — ED Notes (Signed)
Pt began having a rash and itch reaction 3 days ago.  Believes it may be due to the nuts in muffins she ate Friday and Saturday. Sts she "never eats nuts". Has no hx of nut allergy.   Sts she took one Benadryl on Saturday.  Has not eaten any more muffins since Saturday but continues to have an itch and when she scratches "it pops up - it  Hives".

## 2012-06-26 ENCOUNTER — Emergency Department (HOSPITAL_BASED_OUTPATIENT_CLINIC_OR_DEPARTMENT_OTHER)
Admission: EM | Admit: 2012-06-26 | Discharge: 2012-06-26 | Disposition: A | Discharge status: Home / Self Care | Payer: Medicaid Other | Attending: Emergency Medicine | Admitting: Emergency Medicine

## 2012-06-26 DIAGNOSIS — Z889 Allergy status to unspecified drugs, medicaments and biological substances status: Secondary | ICD-10-CM

## 2012-06-26 MED ORDER — FAMOTIDINE 20 MG PO TABS
20.0000 mg | ORAL_TABLET | Freq: Once | ORAL | Status: AC
  Administered 2012-06-26: 20 mg via ORAL
  Filled 2012-06-26: qty 1

## 2012-06-26 MED ORDER — FAMOTIDINE 20 MG PO TABS: 20.0000 mg | ORAL_TABLET | Freq: Two times a day (BID) | ORAL | Status: DC

## 2012-06-26 MED ORDER — LORATADINE 10 MG PO TABS
10.0000 mg | ORAL_TABLET | Freq: Once | ORAL | Status: AC
  Administered 2012-06-26: 10 mg via ORAL
  Filled 2012-06-26: qty 1

## 2012-06-26 MED ORDER — PREDNISONE 20 MG PO TABS: ORAL_TABLET | ORAL | Status: DC

## 2012-06-26 MED ORDER — PREDNISONE 50 MG PO TABS
60.0000 mg | ORAL_TABLET | Freq: Once | ORAL | Status: AC
  Administered 2012-06-26: 60 mg via ORAL
  Filled 2012-06-26: qty 1

## 2012-06-26 NOTE — ED Provider Notes (Signed)
History    This chart was scribed for [name], MD by Donne Anon, ED Scribe. This patient was seen in room MH12/MH12 and the patient's care was started at 0037.   CSN: [id number]  Arrival date & time 06/25/12  2329   First MD Initiated Contact with Patient 06/26/12 0037      Chief Complaint  Patient presents with  . Allergic Reaction     Patient is a 27 y.o. female presenting with allergic reaction. The history is provided by the patient. No language interpreter was used.  Allergic Reaction The primary symptoms are  rash. The primary symptoms do not include wheezing, shortness of breath, nausea or vomiting. The current episode started more than 2 days ago. The problem has not changed since onset.This is a new problem.  The rash began 2 to 7 days ago. The rash appears on the torso, left hand, right hand, right leg, left leg and back. The pain associated with the rash is moderate. The rash is associated with itching. The rash is not associated with blisters or weeping. Risk factors: nuts in muffins she ate for several days.  The onset of the reaction was associated with eating. Significant symptoms also include itching.   Tamara Johnston is a 27 y.o. female who presents to the Emergency Department complaining of sudden onset, constant, non changing rash which began 3 days ago while she was eating nuts. The rash began on her hand and spread to her torso and legs on Saturday. She has tried Benadryl with little relief.  History reviewed. No pertinent past medical history.  Past Surgical History  Procedure Laterality Date  . Cesarean section      No family history on file.  History  Substance Use Topics  . Smoking status: Current Every Day Smoker -- 0.50 packs/day  . Smokeless tobacco: Not on file  . Alcohol Use: Yes     Comment: Occ (had wine cooler today.)    OB History   Grav Para Term Preterm Abortions TAB SAB Ect Mult Living   2 0 0 0 1 1 0 0 0 0       Review of  Systems  HENT: Negative for facial swelling.   Respiratory: Negative for shortness of breath and wheezing.   Gastrointestinal: Negative for nausea and vomiting.  Skin: Positive for itching and rash.  All other systems reviewed and are negative.    Allergies  Review of patient's allergies indicates no known allergies.  Home Medications   Current Outpatient Rx  Name  Route  Sig  Dispense  Refill  . Prenatal Vit w/Fe-Methylfol-FA (PNV PO)   Oral   Take by mouth.           BP 145/98  Pulse 88  Temp(Src) 97.8 F (36.6 C) (Oral)  Resp 16  Ht 5\' 5"  (1.651 m)  Wt 240 lb (108.863 kg)  BMI 39.94 kg/m2  SpO2 100%  LMP 06/09/2011  Breastfeeding? Unknown  Physical Exam  Nursing note and vitals reviewed. Constitutional: She is oriented to person, place, and time. She appears well-developed and well-nourished. No distress.  HENT:  Head: Normocephalic and atraumatic.  Mouth/Throat: Oropharynx is clear and moist.  No swelling on lips or tongue or uvula  Eyes: Conjunctivae and EOM are normal. Pupils are equal, round, and reactive to light.  Neck: Normal range of motion. Neck supple. No tracheal deviation present. No thyromegaly present.  Cardiovascular: Normal rate, regular rhythm and normal heart sounds.   Pulmonary/Chest: Effort  normal and breath sounds normal. No stridor. No respiratory distress. She has no wheezes. She has no rales.  Abdominal: Soft. Bowel sounds are normal. There is no tenderness. There is no rebound and no guarding.  Musculoskeletal: Normal range of motion.  In tact distal sensory.  Neurological: She is alert and oriented to person, place, and time.  Skin: Skin is warm and dry.  1 isolated hive on scapula.  Psychiatric: She has a normal mood and affect. Her behavior is normal.    ED Course  Procedures (including critical care time) DIAGNOSTIC STUDIES: Oxygen Saturation is 100% on room air, normal by my interpretation.    COORDINATION OF CARE: 12:37  AM Discussed treatment plan which includes steroids and Benadryl with pt at bedside and pt agreed to plan.     Labs Reviewed - No data to display No results found.   No diagnosis found.    MDM  Will prescribe prednisone and pepcid. Benadryl every 6 hours.  No further nut eating. Return for worsening symptoms I personally performed the services described in this documentation, which was scribed in my presence. The recorded information has been reviewed and is accurate.         Jasmine Awe, MD 06/26/12 [phone]

## 2012-10-04 ENCOUNTER — Emergency Department (HOSPITAL_BASED_OUTPATIENT_CLINIC_OR_DEPARTMENT_OTHER)
Admission: EM | Admit: 2012-10-04 | Discharge: 2012-10-05 | Disposition: A | Discharge status: Home / Self Care | Payer: Self-pay | Attending: Emergency Medicine | Admitting: Emergency Medicine

## 2012-10-04 ENCOUNTER — Encounter (HOSPITAL_BASED_OUTPATIENT_CLINIC_OR_DEPARTMENT_OTHER): Payer: Self-pay | Admitting: *Deleted

## 2012-10-04 DIAGNOSIS — Z8781 Personal history of (healed) traumatic fracture: Secondary | ICD-10-CM | POA: Insufficient documentation

## 2012-10-04 DIAGNOSIS — F17200 Nicotine dependence, unspecified, uncomplicated: Secondary | ICD-10-CM | POA: Insufficient documentation

## 2012-10-04 DIAGNOSIS — K0889 Other specified disorders of teeth and supporting structures: Secondary | ICD-10-CM

## 2012-10-04 DIAGNOSIS — K089 Disorder of teeth and supporting structures, unspecified: Secondary | ICD-10-CM | POA: Insufficient documentation

## 2012-10-04 MED ORDER — PENICILLIN V POTASSIUM 500 MG PO TABS: 500.0000 mg | ORAL_TABLET | Freq: Four times a day (QID) | ORAL | Status: AC

## 2012-10-04 MED ORDER — HYDROCODONE-ACETAMINOPHEN 5-325 MG PO TABS
1.0000 | ORAL_TABLET | Freq: Four times a day (QID) | ORAL | Status: DC | PRN
Start: 2012-10-04 — End: 2016-05-10

## 2012-10-04 NOTE — ED Notes (Signed)
Ulcer in her gum.

## 2012-10-04 NOTE — ED Provider Notes (Signed)
History     CSN: [id number]  Arrival date & time 10/04/12  2015   First MD Initiated Contact with Patient 10/04/12 2317      Chief Complaint  Patient presents with  . Dental Pain    (Consider location/radiation/quality/duration/timing/severity/associated sxs/prior treatment) HPI This is a 27 year old female who has had problems with her wisdom teeth for over a year. Specifically her lower wisdom teeth have both fractured in the past. The right lower wisdom tooth is fractured done at the gumline. Despite the fracture she has not had significant pain associated with them until 2 days ago when the right lower wisdom tooth became moderately painful. There is purulent drainage associated with it. Pain is worse with eating or drinking. There is also some lesser pain associated with a right upper wisdom tooth  History reviewed. No pertinent past medical history.  Past Surgical History  Procedure Laterality Date  . Cesarean section      No family history on file.  History  Substance Use Topics  . Smoking status: Current Every Day Smoker -- 0.50 packs/day    Types: Cigarettes  . Smokeless tobacco: Not on file  . Alcohol Use: Yes     Comment: Occ (had wine cooler today.)    OB History   Grav Para Term Preterm Abortions TAB SAB Ect Mult Living   2 0 0 0 1 1 0 0 0 0       Review of Systems  All other systems reviewed and are negative.    Allergies  Review of patient's allergies indicates no known allergies.  Home Medications   Current Outpatient Rx  Name  Route  Sig  Dispense  Refill  . famotidine (PEPCID) 20 MG tablet   Oral   Take 1 tablet (20 mg total) by mouth 2 (two) times daily.   10 tablet   0   . predniSONE (DELTASONE) 20 MG tablet      3 tabs po day one, then 2 po daily x 4 days   11 tablet   0   . Prenatal Vit w/Fe-Methylfol-FA (PNV PO)   Oral   Take by mouth.           BP 136/79  Pulse 84  Temp(Src) 98.8 F (37.1 C) (Oral)  Resp 18  Wt 240  lb (108.863 kg)  BMI 39.94 kg/m2  SpO2 99%  LMP 07/19/2011  Physical Exam General: Well-developed, well-nourished female in no acute distress; appearance consistent with age of record HENT: normocephalic, atraumatic; fractured left and right lower third molars with purulent drainage from the right lower third molar and tenderness to percussion Eyes: pupils equal round and reactive to light; extraocular muscles intact Neck: supple; no lymphadenopathy Heart: regular rate and rhythm Lungs: clear to auscultation bilaterally Abdomen: soft; nondistended Extremities: No deformity; full range of motion Neurologic: Awake, alert and oriented; motor function intact in all extremities and symmetric; no facial droop Skin: Warm and dry Psychiatric: Normal mood and affect    ED Course  Procedures (including critical care time)     MDM          Hanley Seamen, MD 10/04/12 2325

## 2012-12-25 IMAGING — US US OB UMBILICAL ART DOPPLER
1 series · 10 of 10 positions shown · non-contrast
Comparison: none

[Series 1: us ob umbilical art doppler · 0.28mm/px · 10 of 10 slices shown]
[im 1/10]
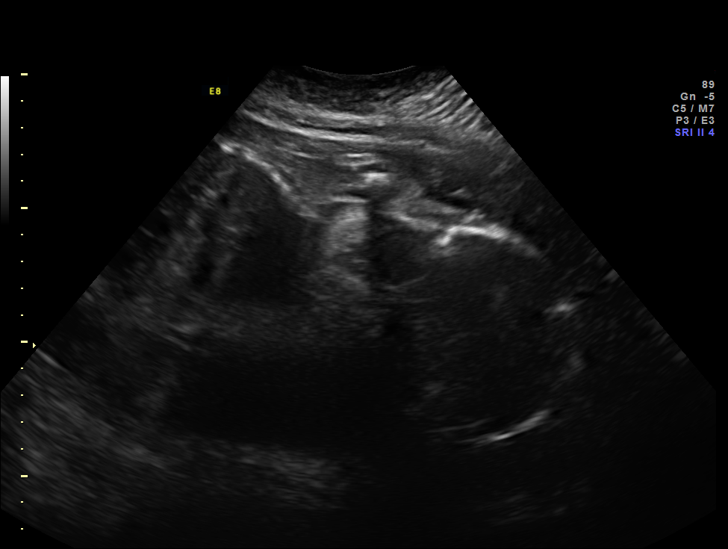
[im 2/10]
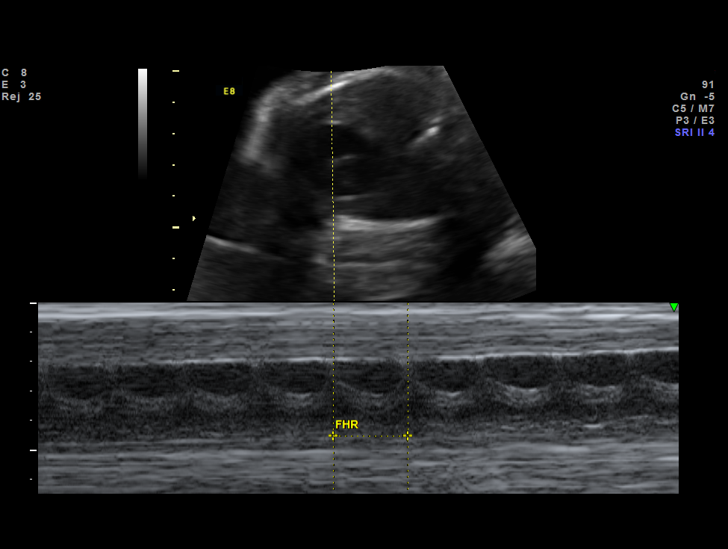
[im 3/10]
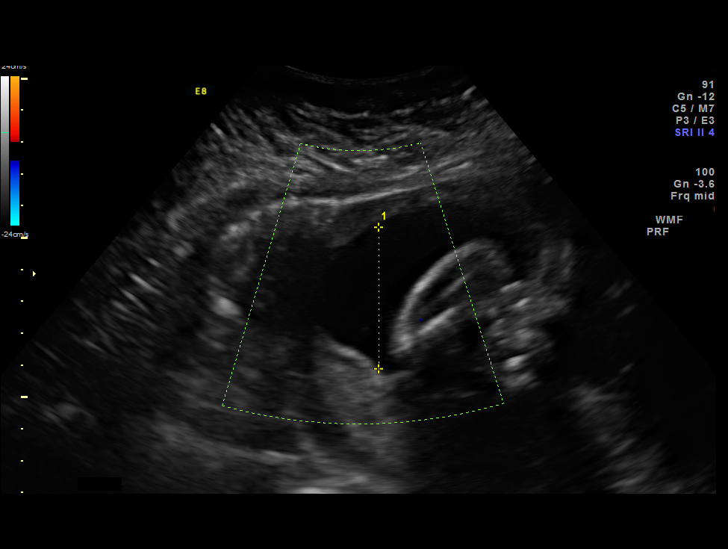
[im 4/10]
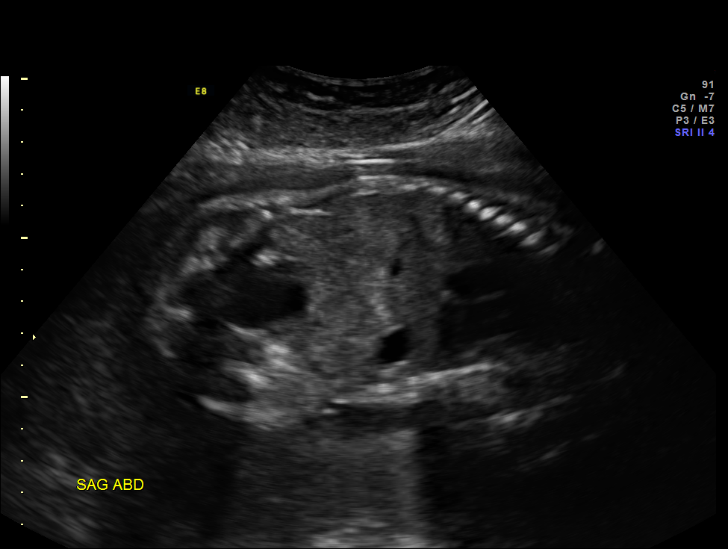
[im 5/10]
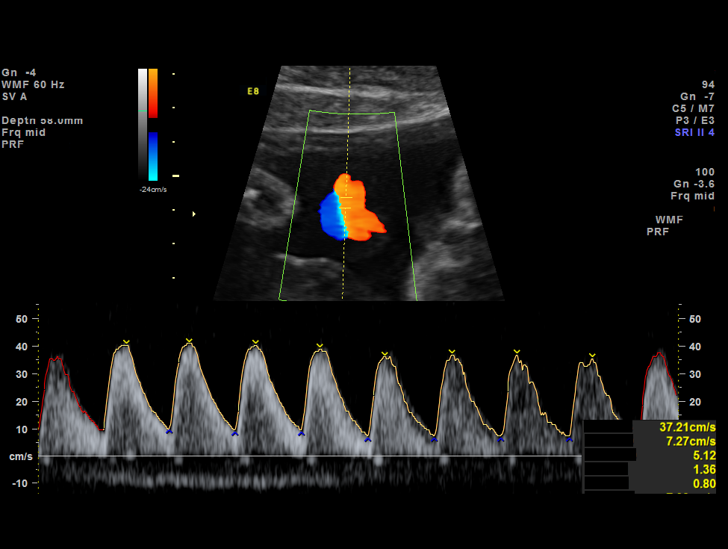
[im 6/10]
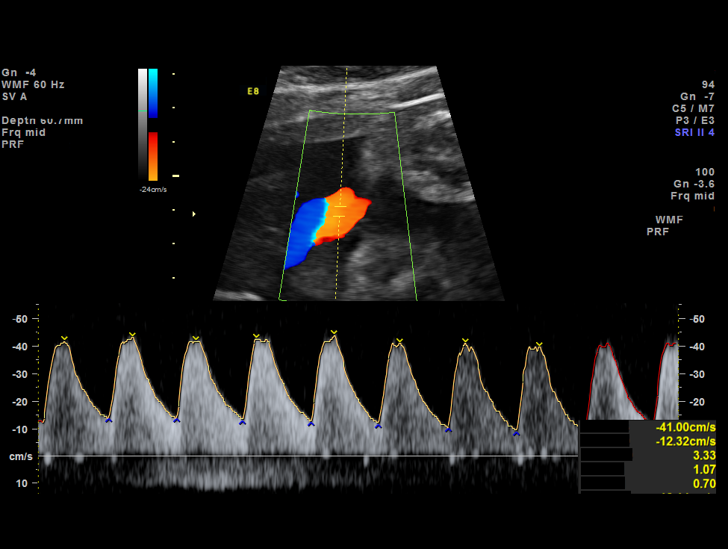
[im 7/10]
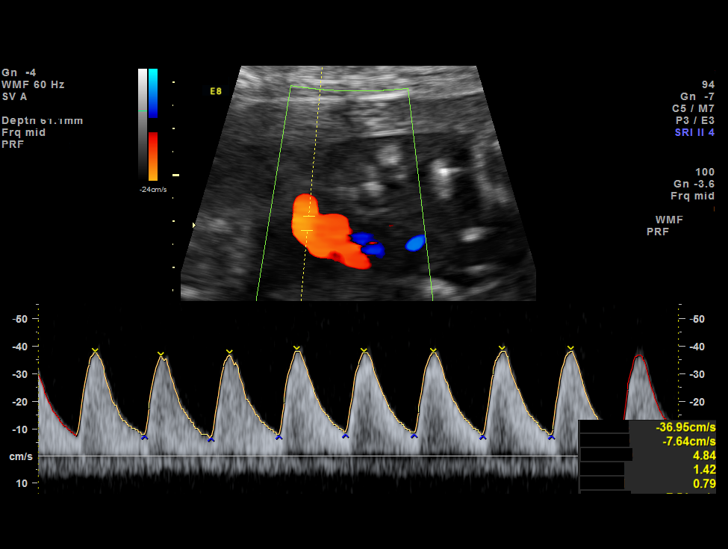
[im 8/10]
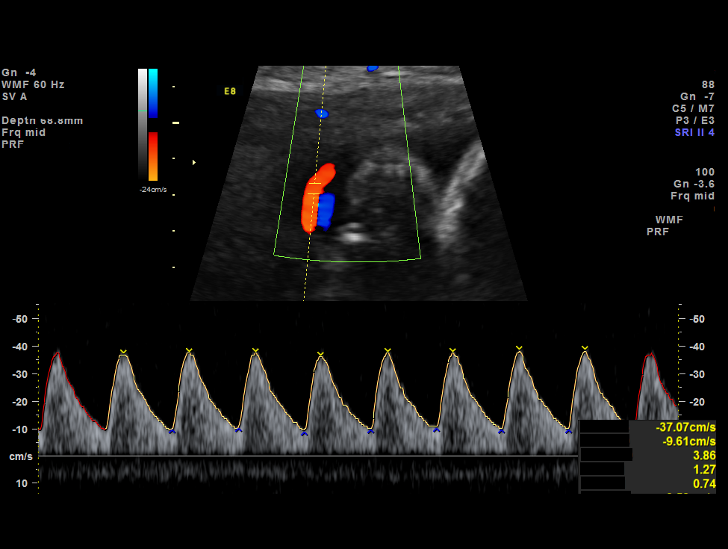
[im 9/10]
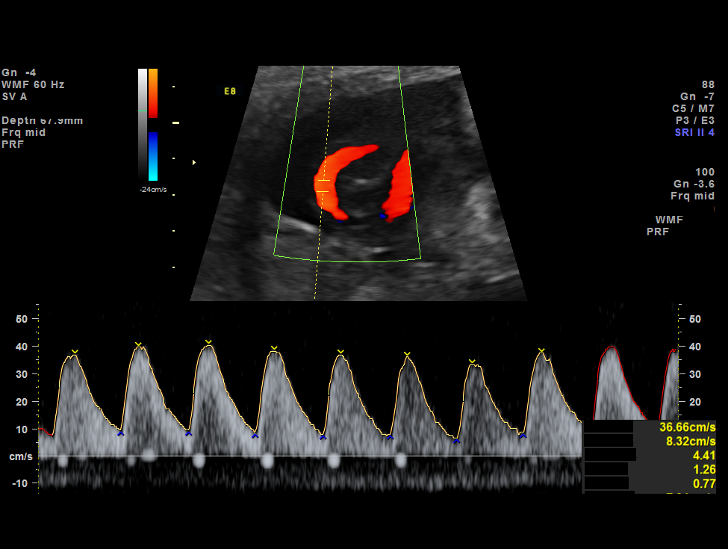
[im 10/10]
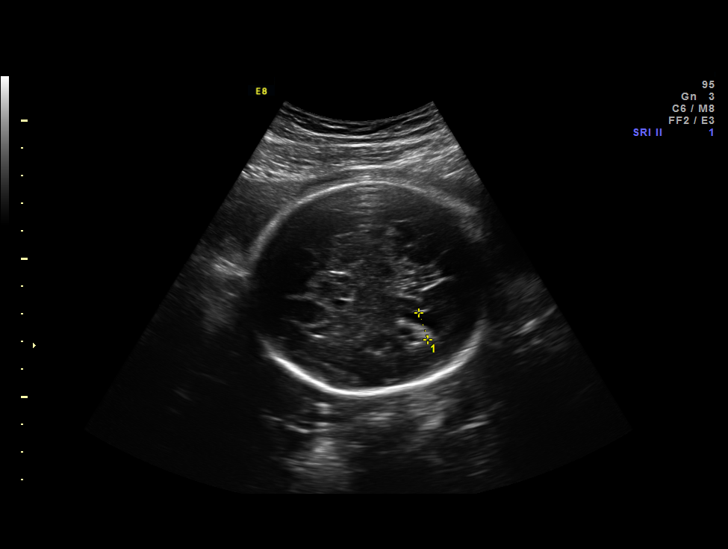

[10 of 10 positions shown; findings below may reference images not displayed]

OBSTETRICS REPORT

Service(s) Provided

 US UA Cord Doppler                                    76820.0
Indications

 Fetal abnormality - occipital encephalocele,
 ventriculomegaly, partial agenesis of corpus
 collosum
 Size less than dates (Small for gestational [AGE]
 FGR)
 Cervical shortening
 Assess fetal well being
Fetal Evaluation

 Num Of Fetuses:    1
 Fetal Heart Rate:  135                          bpm
 Cardiac Activity:  Observed
 Presentation:      Cephalic
 Placenta:          Posterior, above cervical
                    os
 P. Cord            Previously Visualized
 Insertion:

 Amniotic Fluid
 AFI FV:      Subjectively within normal limits
                                             Larg Pckt:     4.4  cm
Biophysical Evaluation

 Amniotic F.V:   Pocket => 2 cm two         F. Tone:        Observed
                 planes
 F. Movement:    Observed                   Score:          [DATE]
 F. Breathing:   Observed
Gestational Age

 LMP:           31w 2d        Date:  07/19/11                 EDD:   04/24/12
 Best:          31w 2d     Det. By:  LMP  (07/19/11)          EDD:   04/24/12
Doppler - Fetal Vessels
 Umbilical Artery
 S/D:   4.31       > 97.5  %tile       RI:
 PI:    1.28                           PSV:       41      cm/s
 Umbilical Artery
 Absent DFV:    No     Reverse DFV:    No

Impression

 IUP at 31+2 weeks
 Encephalocele, mild ventriculomegaly and ACC
 Normal amniotic fluid volume
 UA dopplers were elevated for this GA; continuous diastolic
 flow
 BPP [DATE]
Recommendations

 BPP and UA dopplers in one week

 Thank you for sharing in the care of Ms. OURARI TIGER with
 questions or concerns.
                 Attending Physician, JUMPER

## 2013-01-22 IMAGING — US US FETAL BPP W/O NONSTRESS
1 series · 13 of 18 positions shown · non-contrast
Comparison: none

OBSTETRICS REPORT
                      (Signed Final 03/22/2012 [DATE])

Service(s) Provided
 US UA CORD DOPPLER                                    76820.0
Indications
 Fetal abnormality - occipital encephalocele,
 ventriculomegaly, partial agenesis of corpus
 collosum
 Size less than dates (Small for gestational [AGE]
 FGR)
 Cervical shortening
 Assess fetal well being
Fetal Evaluation
 Num Of Fetuses:    1
 Fetal Heart Rate:  147                          bpm
 Cardiac Activity:  Observed
 Presentation:      Cephalic
 Placenta:          Posterior, above cervical
                    os
 P. Cord            Previously Visualized
 Insertion:
 Amniotic Fluid
 AFI FV:      Subjectively within normal limits
 AFI Sum:     12.95   cm       43  %Tile     Larg Pckt:    4.89  cm
 RUQ:   4.89    cm   RLQ:    2.58   cm    LUQ:   2.93    cm   LLQ:    2.55   cm
Biophysical Evaluation
 Amniotic F.V:   Within normal limits       F. Tone:        Observed
 F. Movement:    Observed                   Score:          [DATE]
 F. Breathing:   Observed
Gestational Age
 LMP:           35w 2d        Date:  07/19/11                 EDD:   04/24/12
 Best:          35w 2d     Det. By:  LMP  (07/19/11)          EDD:   04/24/12
Doppler - Fetal Vessels
 Umbilical Artery
 S/D:   3.94       > 97.5  %tile       RI:
 PI:    1.28                           PSV:       47.63   cm/s
Cervix Uterus Adnexa
 Cervix:       Not visualized (advanced GA >38wks)
Impression
INDICATION: 26 yr old [id number] at 49w5d with fetus with
 occipital encephalocele and fetal growth restriction with
 elevated umbilical artery Doppler studies for biophysical
 profile and Doppler studies.

[Series 1: us fetal bpp w/o nonstress · 13 of 18 slices shown]
[im 1/18]
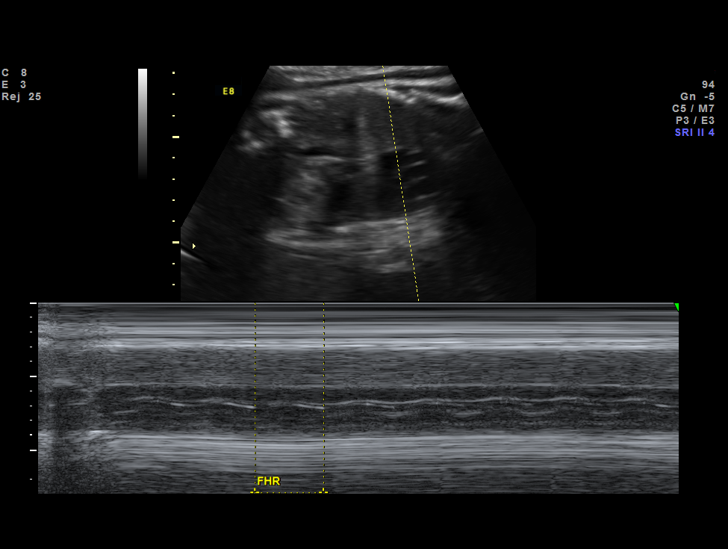
[im 3/18]
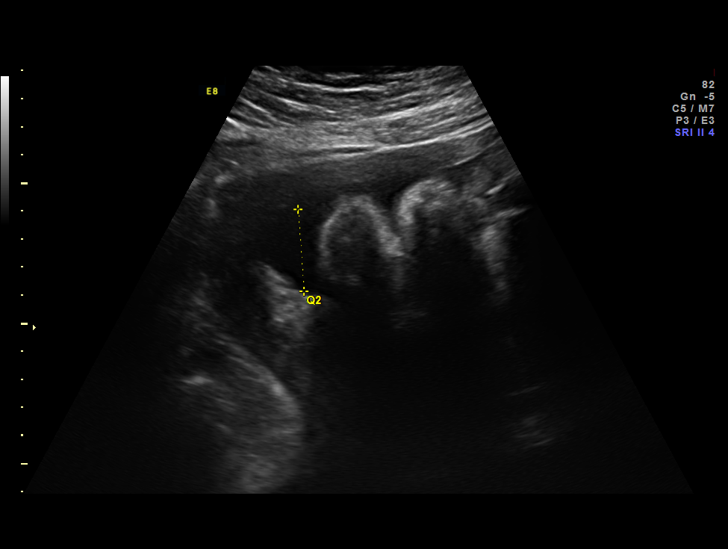
[im 4/18]
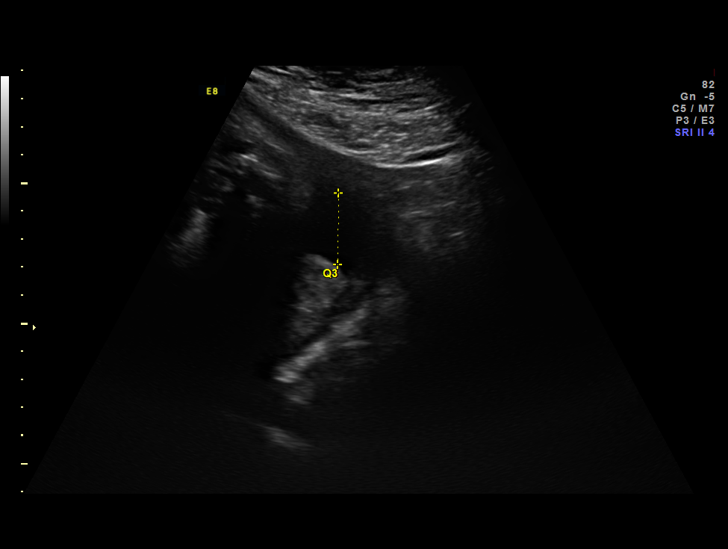
[im 5/18]
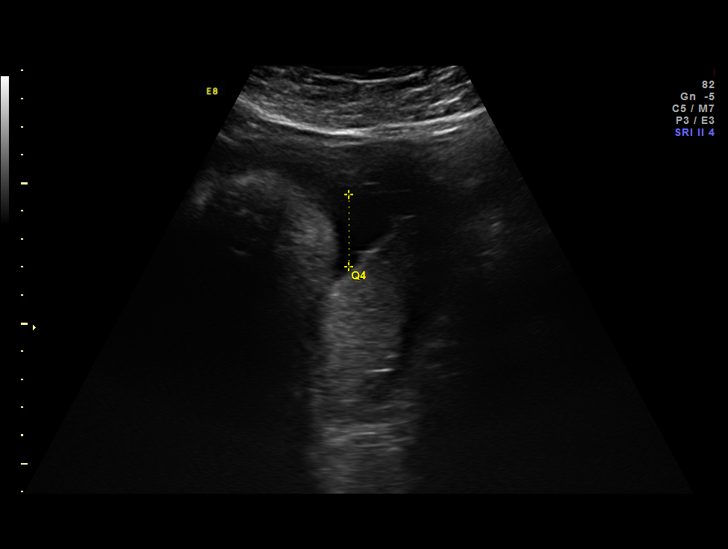
[im 7/18]
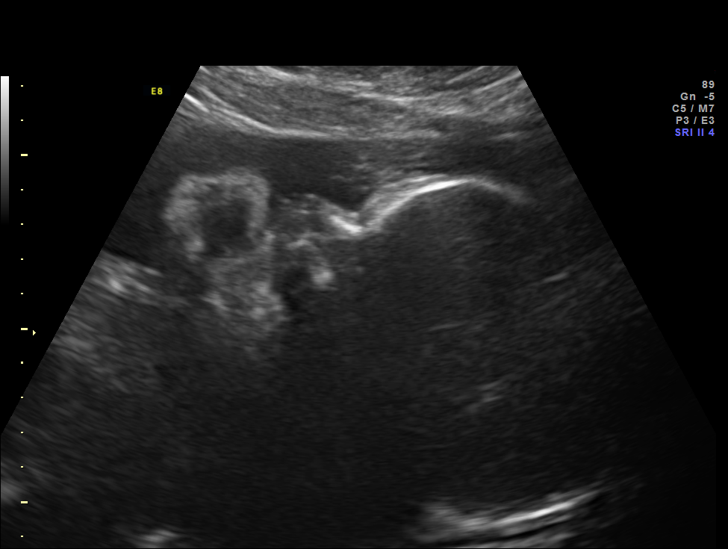
[im 8/18]
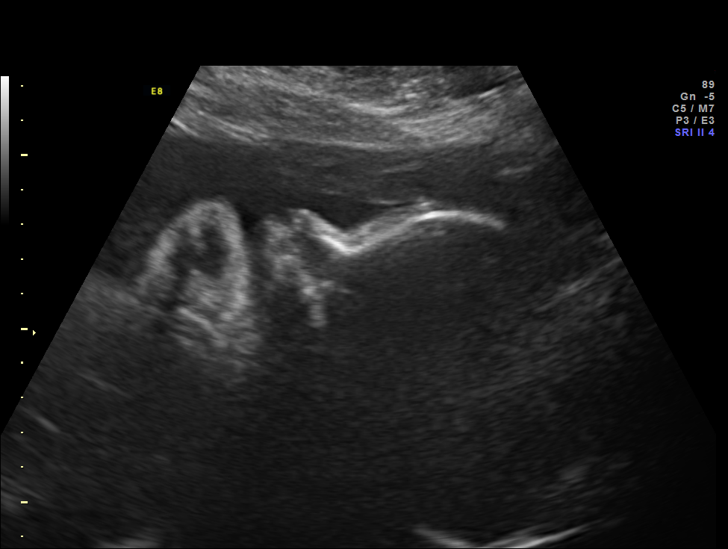
[im 10/18]
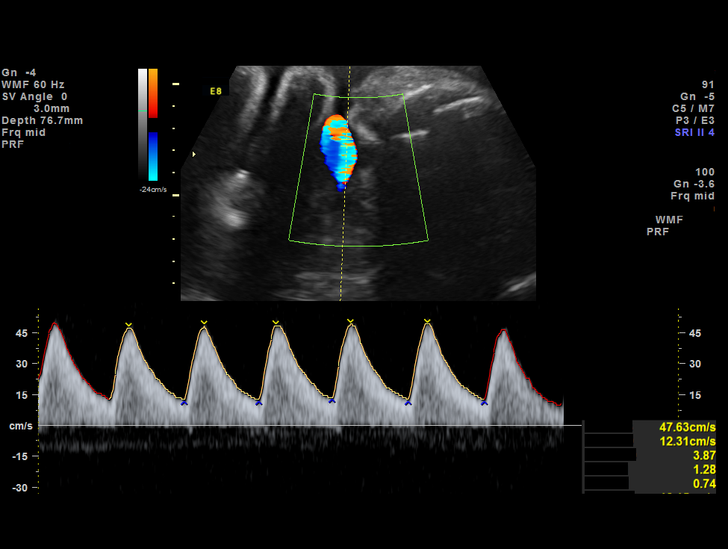
[im 11/18]
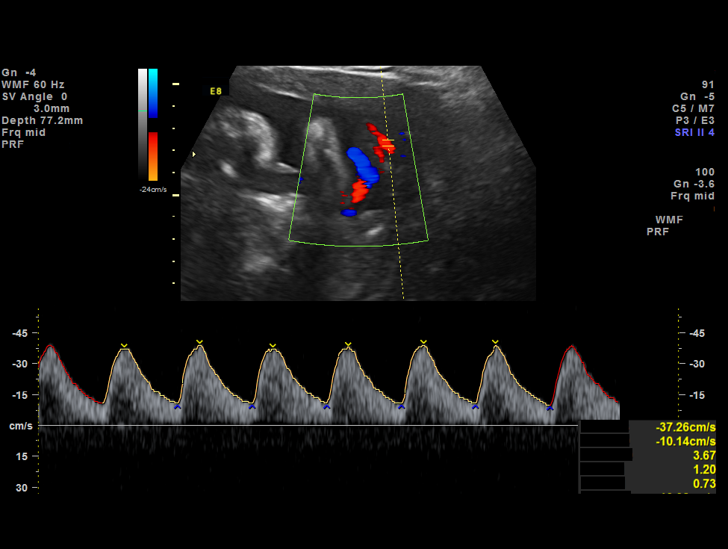
[im 12/18]
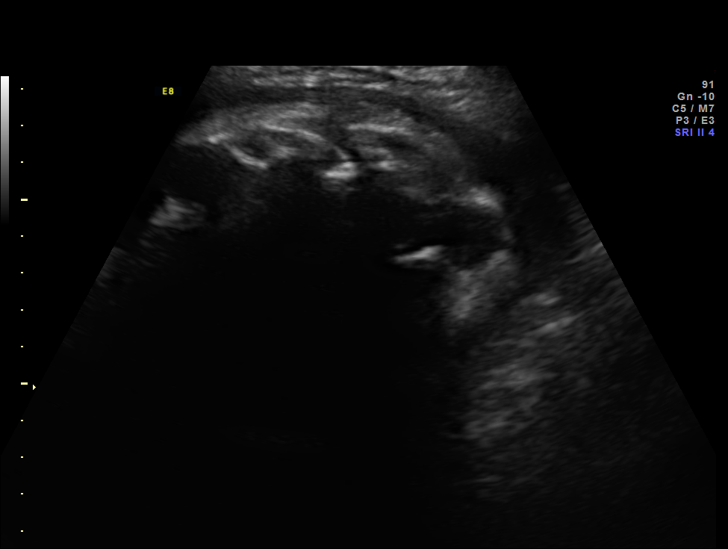
[im 14/18]
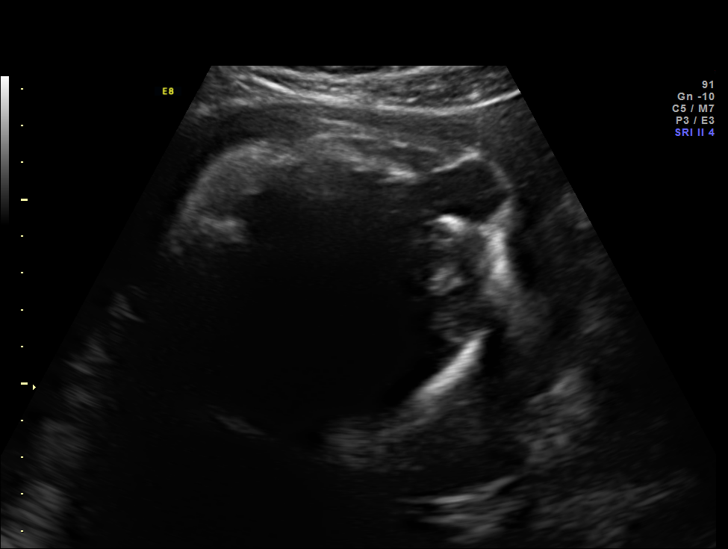
[im 15/18]
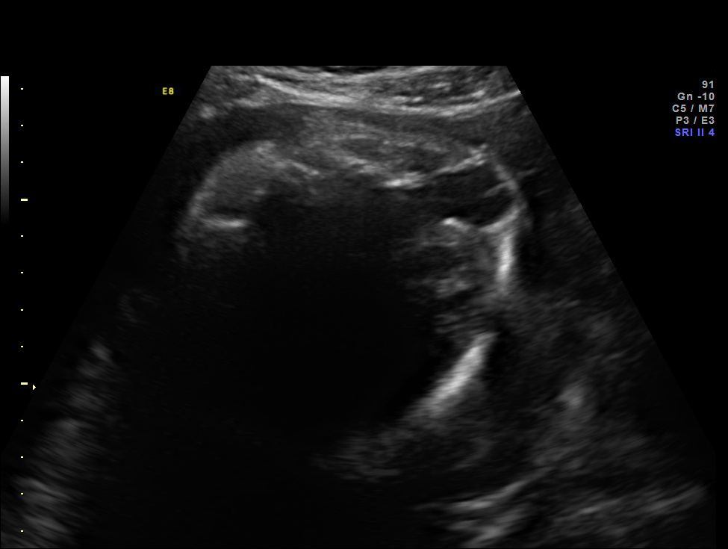
[im 16/18]
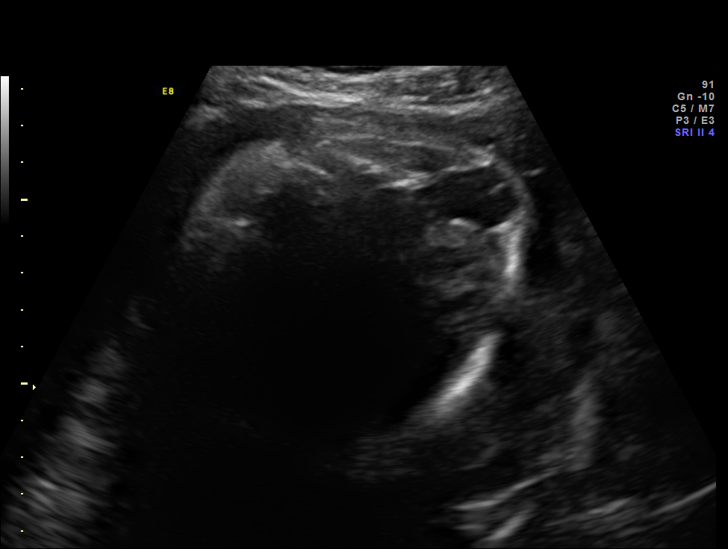
[im 18/18]
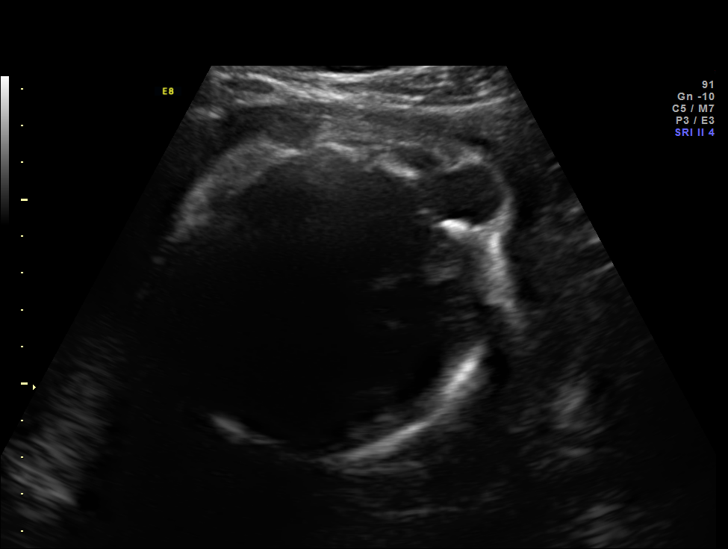

[13 of 18 positions shown; findings below may reference images not displayed]

FINDINGS: 1. Single intrauterine pregnancy.
 2. Posterior placenta without evidence of previa.
 3. Normal amniotic fluid volume.
 4. The lateral ventricles appear normal on today's exam.
 5. The umbilical artery Doppler studies again show and
 elevated S/D ratio. There is persistent forward flow.
 6. Normal biophysical profile of [DATE].
Recommendations

 1. Fetal encephalocele:
 - previously counseled
 - had met with Pediatric Neurosurgery
 - patient is scheduled for induction on 04/04/12.
 2. Fetal growth restriction with abnormal Doppler studies:
 - previously counseled
 - has received Naja
 - continue antenatal testing and weekly Doppler studies
 - had fetal growth on 03/20/12- continues to be <3rd% but
 there was adequate interval growth
 - will be delivered at 37 weeks gestation
 3. Discussed fetal Elder R Arenival

 Thank you for sharing in the care of Ms. RICHARD OSWALDO MEGIA with
 questions or concerns.

## 2013-01-30 IMAGING — US US FETAL BPP W/O NONSTRESS
1 series · 16 of 28 positions shown · non-contrast
Comparison: none

OBSTETRICS REPORT
                      (Signed Final 03/30/2012 [DATE])

Service(s) Provided
 US UA CORD DOPPLER                                    76820.0
 US OB FOLLOW UP                                       76816.1
Indications
 Fetal abnormality - occipital encephalocele,
 ventriculomegaly, partial agenesis of corpus
 collosum
 Size less than dates (Small for gestational [AGE]
 FGR)
 Cervical shortening
 Assess fetal well being
Fetal Evaluation
 Num Of Fetuses:    1
 Fetal Heart Rate:  131                          bpm
 Cardiac Activity:  Observed
 Presentation:      Cephalic
 Placenta:          Posterior, above cervical
                    os
 P. Cord            Previously Visualized
 Insertion:
 Amniotic Fluid
 AFI FV:      Subjectively within normal limits
 AFI Sum:     15.14   cm       56  %Tile     Larg Pckt:     4.8  cm
 RUQ:   4.8     cm   RLQ:    2.52   cm    LUQ:   4.67    cm   LLQ:    3.15   cm
Biophysical Evaluation
 Amniotic F.V:   Pocket => 2 cm two         F. Tone:        Observed
                 planes
 F. Movement:    Observed                   Score:          [DATE]
 F. Breathing:   Observed
Biometry
 BPD:     85.3  mm     G. Age:  34w 2d                CI:         76.6   70 - 86
 OFD:    111.4  mm                                    FL/HC:      19.9   20.1 -
 HC:     314.4  mm     G. Age:  35w 2d        6  %    HC/AC:      1.14   0.93 -
 AC:     275.6  mm     G. Age:  31w 5d      < 3  %    FL/BPD:     73.5   71 - 87
 FL:      62.7  mm     G. Age:  32w 3d      < 3  %    FL/AC:      22.8   20 - 24
 HUM:     53.1  mm     G. Age:  31w 0d      < 5  %
 Est. FW:    2312  gm      4 lb 6 oz   < 10  %
Gestational Age
 LMP:           36w 3d        Date:  07/19/11                 EDD:   04/24/12
 U/S Today:     33w 3d                                        EDD:   05/15/12
 Best:          36w 3d     Det. By:  LMP  (07/19/11)          EDD:   04/24/12
Anatomy
 Cranium:          Abnormal, see          Aortic Arch:      Previously seen
                   comments
 Fetal Cavum:      Previously seen        Ductal Arch:      Not well visualized
 Ventricles:       Abnormal, see          Diaphragm:        Previously seen
 Choroid Plexus:   Previously seen        Stomach:          Appears normal, left
                                                            sided
 Cerebellum:       Abnormal, see          Abdomen:          Previously seen
 Posterior Fossa:  Enlarged cisternal     Abdominal Wall:   Previously seen
                   magna,  mm
 Nuchal Fold:      Not applicable (>20    Cord Vessels:     Previously seen
                   wks GA)
 Face:             Previously seen        Kidneys:          Appear normal
 Lips:             Previously seen        Bladder:          Appears normal
 Heart:            Previously seen        Spine:            Previously seen
 RVOT:             Previously seen        Lower             Previously seen
                                          Extremities:
 LVOT:             Previously seen        Upper             Previously seen
 Other:  Male gender. Heels and 5th digit previously seen.
Doppler - Fetal Vessels
 Umbilical Artery
 S/D:   2.97           79  %tile       RI:
 PI:    1.02                           PSV:       38.79   cm/s
Cervix Uterus Adnexa
 Cervix:       Not visualized (advanced GA >87wks)
 Uterus:       No abnormality visualized.
 Cul De Sac:   No free fluid seen.
 Left Ovary:    Not visualized.
 Right Ovary:   Not visualized.
 Adnexa:     No abnormality visualized.
Impression
INDICATION: 26 yr old [id number] at 66w6d with fetus with
 occipital encephalocele and fetal growth restriction with
 elevated umbilical artery Doppler studies for biophysical
 profile and Doppler studies.

[Series 1: us fetal bpp w/o nonstress · 0.23mm/px · 47 acquisitions, 16 frames shown]
[im 1/47]
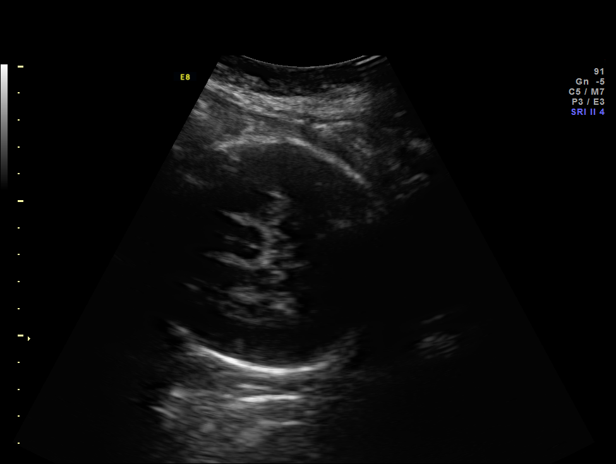
[im 4/47]
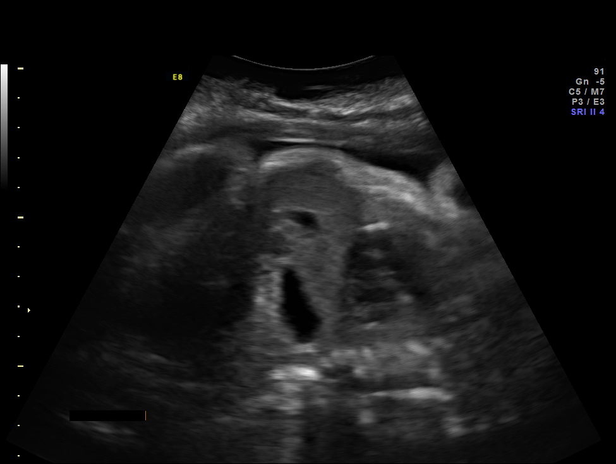
[im 7/47]
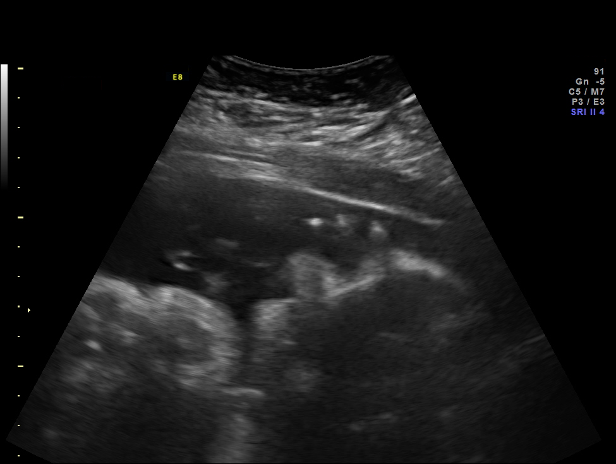
[im 11/47]
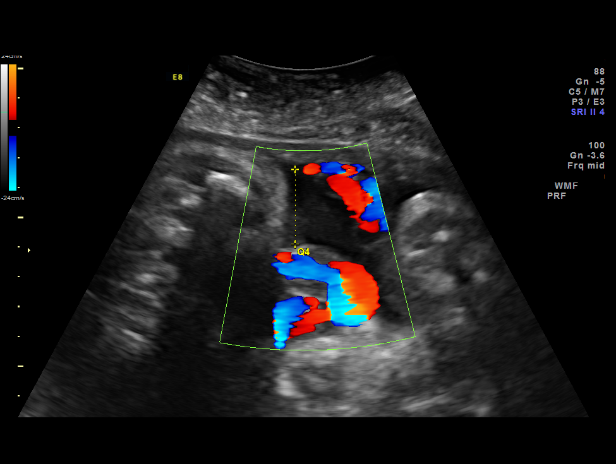
[im 12/47]
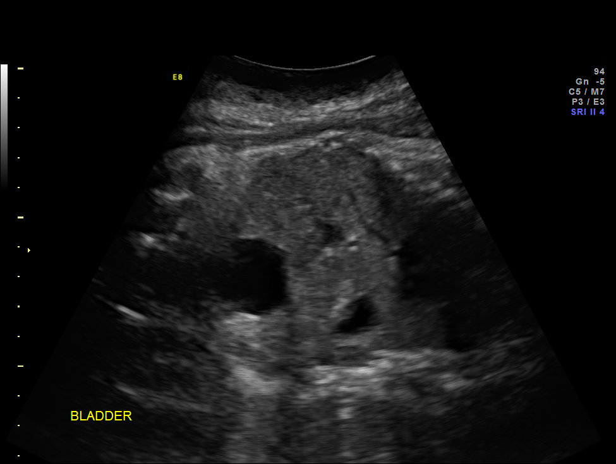
[im 16/47]
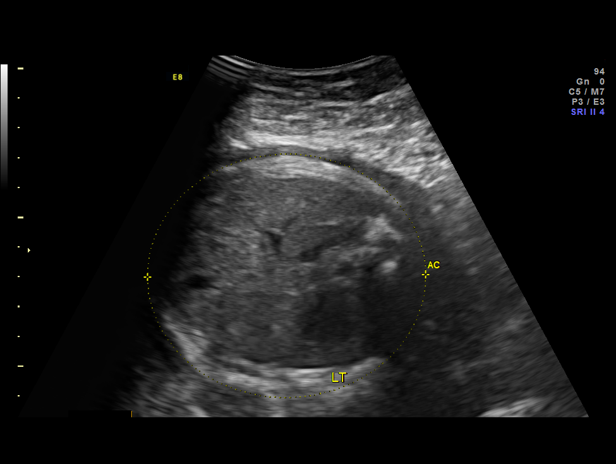
[im 19/47]
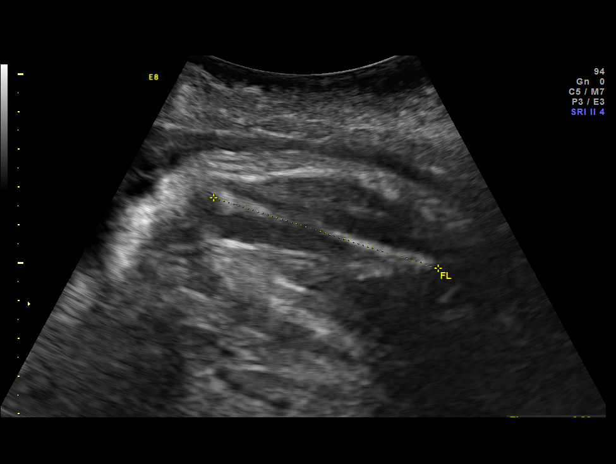
[im 23/47]
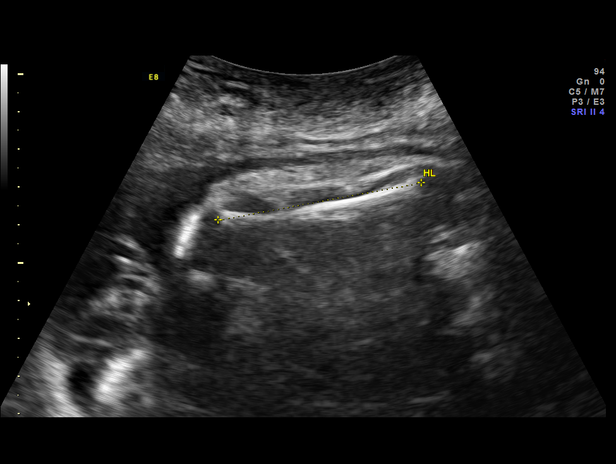
[im 24/47]
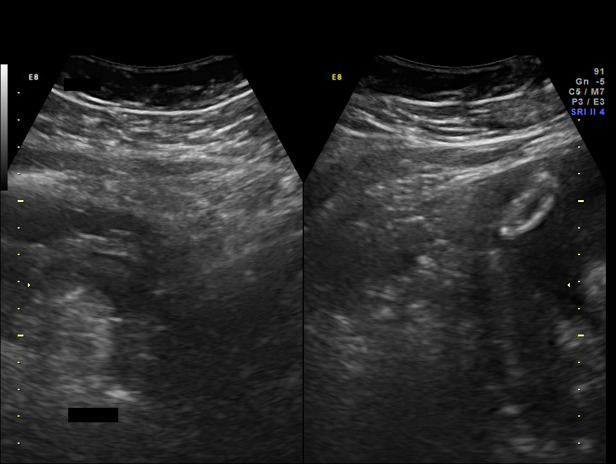
[im 28/47]
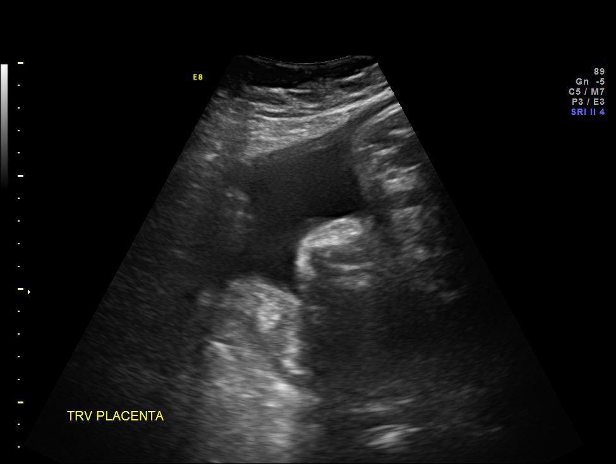
[im 31/47]
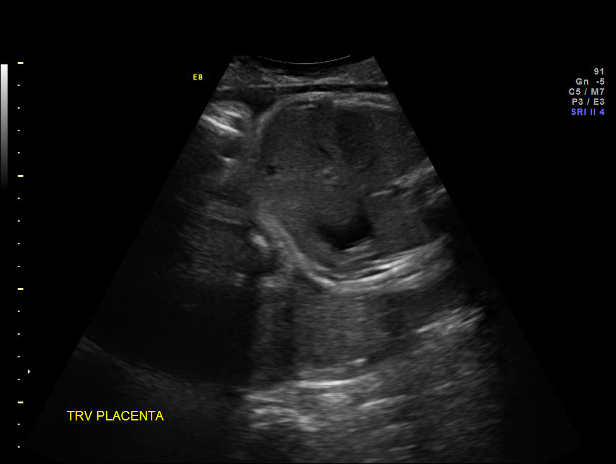
[im 35/47]
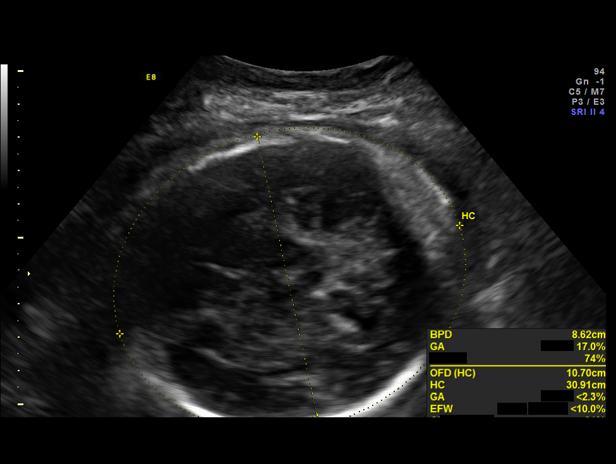
[im 36/47]
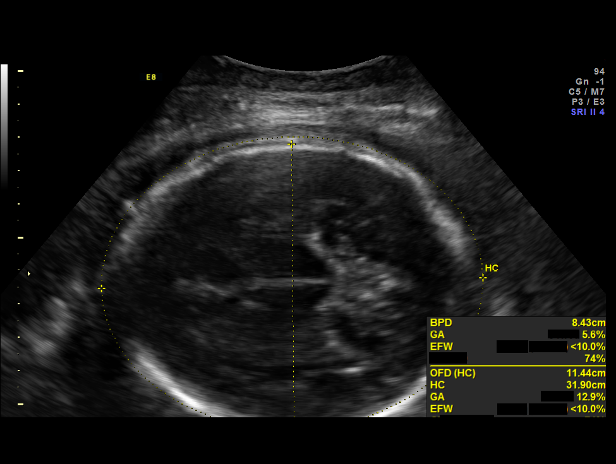
[im 40/47]
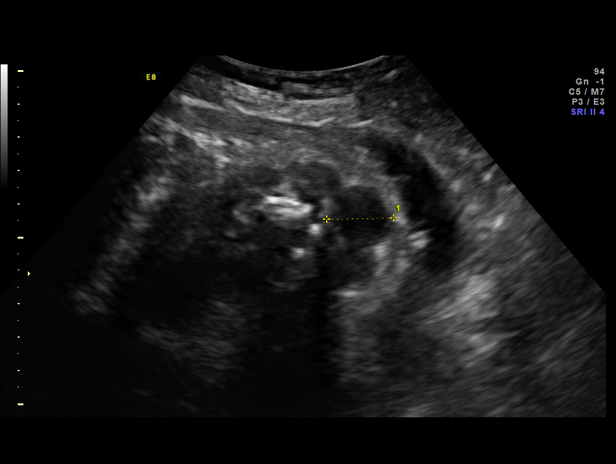
[im 43/47]
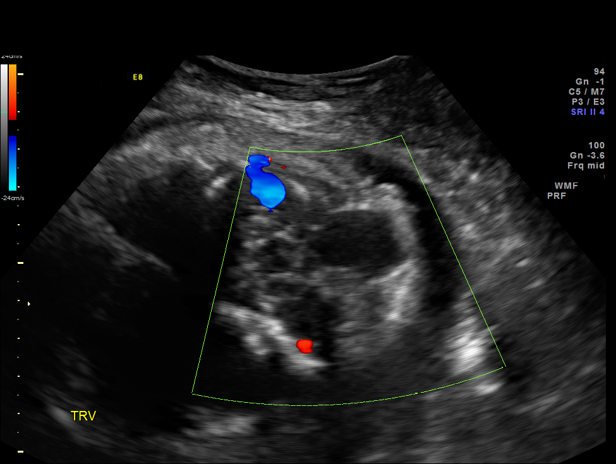
[im 47/47]
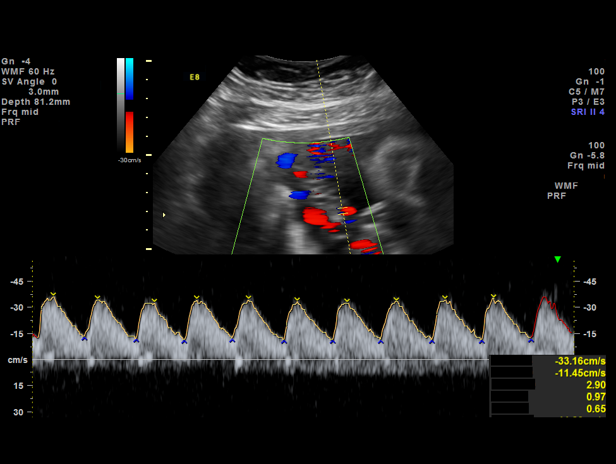

[16 of 28 positions shown; findings below may reference images not displayed]

FINDINGS: 1. Single intrauterine pregnancy with EFW <10th percentile
 but appropriate interval growth.
 2. Posterior placenta without evidence of previa.
 3. Normal amniotic fluid volume.
 4. The lateral ventricles appear normal on today's exam.
 5. The umbilical artery Doppler studies again show and
 elevated S/D ratio. There is persistent forward flow.
 6. Normal biophysical profile of [DATE].
Recommendations

 1. Fetal encephalocele:
 - previously counseled
 - had met with Pediatric Neurosurgery
 - patient is scheduled for induction on 04/04/12.
 2. Fetal growth restriction with abnormal Doppler studies:
 - previously counseled
 - has received Tiger
 - continue antenatal testing and weekly Doppler studies
 - had fetal growth today continues to be IUGR but there was
 adequate interval growth
 - will be delivered at 37 weeks gestation (04/04/12)
 3. Discussed fetal Kayon Jensen

 Thank you for sharing in the care of Ms. KARLA PEJERREY with
 questions or concerns.

## 2014-03-03 ENCOUNTER — Encounter (HOSPITAL_BASED_OUTPATIENT_CLINIC_OR_DEPARTMENT_OTHER): Payer: Self-pay | Admitting: *Deleted

## 2016-05-10 ENCOUNTER — Encounter: Payer: Self-pay | Admitting: Emergency Medicine

## 2016-05-10 ENCOUNTER — Emergency Department (HOSPITAL_BASED_OUTPATIENT_CLINIC_OR_DEPARTMENT_OTHER)
Admission: EM | Admit: 2016-05-10 | Discharge: 2016-05-10 | Discharge status: Against Medical Advice | Payer: Medicaid Other | Attending: Dermatology | Admitting: Dermatology

## 2016-05-10 DIAGNOSIS — O209 Hemorrhage in early pregnancy, unspecified: Secondary | ICD-10-CM | POA: Insufficient documentation

## 2016-05-10 DIAGNOSIS — F17210 Nicotine dependence, cigarettes, uncomplicated: Secondary | ICD-10-CM | POA: Diagnosis not present

## 2016-05-10 DIAGNOSIS — Z3A01 Less than 8 weeks gestation of pregnancy: Secondary | ICD-10-CM | POA: Insufficient documentation

## 2016-05-10 DIAGNOSIS — Z5321 Procedure and treatment not carried out due to patient leaving prior to being seen by health care provider: Secondary | ICD-10-CM | POA: Insufficient documentation

## 2016-05-10 DIAGNOSIS — O99331 Smoking (tobacco) complicating pregnancy, first trimester: Secondary | ICD-10-CM | POA: Diagnosis not present

## 2016-05-10 LAB — URINALYSIS, ROUTINE W REFLEX MICROSCOPIC
Glucose, UA: NEGATIVE mg/dL
Ketones, ur: NEGATIVE mg/dL
Nitrite: NEGATIVE
PH: 6.5 (ref 5.0–8.0)
Protein, ur: NEGATIVE mg/dL
SPECIFIC GRAVITY, URINE: 1.034 — AB (ref 1.005–1.030)

## 2016-05-10 LAB — URINALYSIS, MICROSCOPIC (REFLEX)

## 2016-05-10 LAB — PREGNANCY, URINE: Preg Test, Ur: POSITIVE — AB

## 2016-05-10 NOTE — ED Notes (Signed)
Pt not in ED WR 

## 2016-05-10 NOTE — ED Notes (Signed)
Pt to triage-is tearful and states that she has to leave by 230pm to pick up her child at school-advised she would be seen ASAP but that is doubtful she would be seen and d/c within 30 min-asked and notified she will need to check back in if she leaves-NAD-steady gait

## 2016-05-10 NOTE — ED Triage Notes (Signed)
C/o vaginal "spotting" started last night-not enough to wear a pad-pt thinks she is pregnant-pos home preg test-NAD-steady gait

## 2016-05-11 ENCOUNTER — Telehealth (HOSPITAL_BASED_OUTPATIENT_CLINIC_OR_DEPARTMENT_OTHER): Payer: Self-pay | Admitting: Emergency Medicine

## 2022-10-11 ENCOUNTER — Emergency Department (HOSPITAL_BASED_OUTPATIENT_CLINIC_OR_DEPARTMENT_OTHER): Payer: Medicaid Other

## 2022-10-11 ENCOUNTER — Encounter (HOSPITAL_BASED_OUTPATIENT_CLINIC_OR_DEPARTMENT_OTHER): Payer: Self-pay | Admitting: Urology

## 2022-10-11 ENCOUNTER — Other Ambulatory Visit: Payer: Self-pay

## 2022-10-11 ENCOUNTER — Emergency Department (HOSPITAL_BASED_OUTPATIENT_CLINIC_OR_DEPARTMENT_OTHER)
Admission: EM | Admit: 2022-10-11 | Discharge: 2022-10-11 | Disposition: A | Discharge status: Home / Self Care | Payer: Medicaid Other | Attending: Emergency Medicine | Admitting: Emergency Medicine

## 2022-10-11 DIAGNOSIS — S93602A Unspecified sprain of left foot, initial encounter: Secondary | ICD-10-CM | POA: Diagnosis not present

## 2022-10-11 DIAGNOSIS — S99912A Unspecified injury of left ankle, initial encounter: Secondary | ICD-10-CM | POA: Diagnosis present

## 2022-10-11 DIAGNOSIS — X501XXA Overexertion from prolonged static or awkward postures, initial encounter: Secondary | ICD-10-CM | POA: Diagnosis not present

## 2022-10-11 MED ORDER — OXYCODONE-ACETAMINOPHEN 5-325 MG PO TABS
1.0000 | ORAL_TABLET | Freq: Once | ORAL | Status: AC
  Administered 2022-10-11: 1 via ORAL
  Filled 2022-10-11: qty 1

## 2022-10-11 NOTE — ED Triage Notes (Signed)
Pt states approx 2 hours ago stepped down on left foot wrong and felt a "pop" pain and swelling worse  Pain in lateral left foot and ankle

## 2022-10-11 NOTE — ED Notes (Signed)
Pt. Out of room and at the door ready to go.  Placed Pt. In W/C and taken to the front.  Went over the Pt. D/C papers and Pt. Had no complaints of the visit.

## 2022-10-11 NOTE — ED Notes (Signed)
Pt. Has an injury to the L out side foot with no noted deformity and no noted bruising.  Pt. Is in a lot of pain and very tearful.  Pt. Reports she slid down the hill while picking flowers.  Pt. Has a small knot noted on the outer distal side of the L foot.Marland Kitchen

## 2022-10-11 NOTE — ED Provider Notes (Signed)
  [place] [facility] Provider Note   CSN: [id number] Arrival date & time: 10/11/22  1826     History  Chief Complaint  Patient presents with   Ankle Injury    Tamara Johnston is a 37 y.o. female.   Ankle Injury  Patient presents with left foot and ankle injury.  Reportedly stepped down wrong on her left foot and felt a pop.  Severe pain.  States she was picking flowers.  No other injury.     Home Medications Prior to Admission medications   Not on File      Allergies    Patient has no known allergies.    Review of Systems   Review of Systems  Physical Exam Updated Vital Signs BP (!) 167/102 (BP Location: Left Arm)   Pulse 61   Temp 98.8 F (37.1 C)   Resp 18   Ht 5\' 6"  (1.676 m)   Wt 108.9 kg   SpO2 95%   BMI 38.75 kg/m  Physical Exam Vitals and nursing note reviewed.  Cardiovascular:     Rate and Rhythm: Regular rhythm.  Abdominal:     Tenderness: There is no abdominal tenderness.  Musculoskeletal:        General: Tenderness present.     Comments: Tenderness to left midfoot and lateral left foot.  No deformity.  Mild edema.  No ankle tenderness.  Good range of motion.  Sensation intact distally.  No tenderness over the proximal lower leg.  Skin:    Capillary Refill: Capillary refill takes less than 2 seconds.  Neurological:     Mental Status: She is alert and oriented to person, place, and time.     ED Results / Procedures / Treatments   Labs (all labs ordered are listed, but only abnormal results are displayed) Labs Reviewed - No data to display  EKG None  Radiology DG Foot Complete Left  Result Date: 10/11/2022 CLINICAL DATA:  Ankle injury, stepped down wrong. Felt a pop. EXAM: LEFT FOOT - COMPLETE 3+ VIEW COMPARISON:  None Available. FINDINGS: There is no evidence of fracture or dislocation. Normal alignment and joint spaces. There is no evidence of arthropathy or other focal bone abnormality. Soft tissues  are unremarkable. IMPRESSION: No fracture or dislocation of the left foot. Electronically Signed   By: Narda Rutherford M.D.   On: 10/11/2022 19:47    Procedures Procedures    Medications Ordered in ED Medications  oxyCODONE-acetaminophen (PERCOCET/ROXICET) 5-325 MG per tablet 1 tablet (1 tablet Oral Given 10/11/22 1910)    ED Course/ Medical Decision Making/ A&P                             Medical Decision Making Amount and/or Complexity of Data Reviewed Radiology: ordered.  Risk Prescription drug management.   Patient with foot pain after eversion injury.  Differential diagnosis includes fracture sprain or dislocation.  X-ray independently interpreted and reassuring.  Also reviewed radiology note.  Will give boot for comfort.  Follow-up with Ortho as needed.  Likely sprain.        Final Clinical Impression(s) / ED Diagnoses Final diagnoses:  Sprain of left foot, initial encounter    Rx / DC Orders ED Discharge Orders     None         Benjiman Core, MD 10/11/22 1958
